# Patient Record
Sex: Female | Born: 1990 | Race: White | Hispanic: No | Marital: Married | Smoking: Former smoker
Health system: Southern US, Community
[De-identification: ages and names within clinical notes are randomized; demographics above are authoritative.]

## PROBLEM LIST (undated history)

## (undated) ENCOUNTER — Inpatient Hospital Stay (HOSPITAL_COMMUNITY): Payer: Self-pay

## (undated) DIAGNOSIS — F419 Anxiety disorder, unspecified: Secondary | ICD-10-CM

## (undated) DIAGNOSIS — B009 Herpesviral infection, unspecified: Secondary | ICD-10-CM

## (undated) DIAGNOSIS — J45909 Unspecified asthma, uncomplicated: Secondary | ICD-10-CM

## (undated) HISTORY — PX: APPENDECTOMY: SHX54

---

## 2018-03-15 ENCOUNTER — Inpatient Hospital Stay (HOSPITAL_COMMUNITY): Payer: Medicaid Other

## 2018-03-15 ENCOUNTER — Encounter (HOSPITAL_COMMUNITY): Payer: Self-pay | Admitting: *Deleted

## 2018-03-15 ENCOUNTER — Other Ambulatory Visit: Payer: Self-pay

## 2018-03-15 ENCOUNTER — Inpatient Hospital Stay (HOSPITAL_COMMUNITY)
Admission: AD | Admit: 2018-03-15 | Discharge: 2018-03-15 | Disposition: A | Discharge status: Home / Self Care | Payer: Medicaid Other | Source: Ambulatory Visit | Attending: Obstetrics and Gynecology | Admitting: Obstetrics and Gynecology

## 2018-03-15 DIAGNOSIS — O200 Threatened abortion: Secondary | ICD-10-CM | POA: Insufficient documentation

## 2018-03-15 DIAGNOSIS — O209 Hemorrhage in early pregnancy, unspecified: Secondary | ICD-10-CM

## 2018-03-15 DIAGNOSIS — O26891 Other specified pregnancy related conditions, first trimester: Secondary | ICD-10-CM

## 2018-03-15 DIAGNOSIS — O208 Other hemorrhage in early pregnancy: Secondary | ICD-10-CM | POA: Diagnosis not present

## 2018-03-15 DIAGNOSIS — R109 Unspecified abdominal pain: Secondary | ICD-10-CM | POA: Diagnosis not present

## 2018-03-15 HISTORY — DX: Anxiety disorder, unspecified: F41.9

## 2018-03-15 HISTORY — DX: Unspecified asthma, uncomplicated: J45.909

## 2018-03-15 HISTORY — DX: Herpesviral infection, unspecified: B00.9

## 2018-03-15 LAB — WET PREP, GENITAL
CLUE CELLS WET PREP: NONE SEEN
Sperm: NONE SEEN
TRICH WET PREP: NONE SEEN
Yeast Wet Prep HPF POC: NONE SEEN

## 2018-03-15 LAB — URINALYSIS, ROUTINE W REFLEX MICROSCOPIC
BACTERIA UA: NONE SEEN
Bilirubin Urine: NEGATIVE
GLUCOSE, UA: NEGATIVE mg/dL
Ketones, ur: NEGATIVE mg/dL
Leukocytes, UA: NEGATIVE
Nitrite: NEGATIVE
PH: 7 (ref 5.0–8.0)
PROTEIN: NEGATIVE mg/dL
Specific Gravity, Urine: 1.004 — ABNORMAL LOW (ref 1.005–1.030)

## 2018-03-15 LAB — CBC
HCT: 37.4 % (ref 36.0–46.0)
Hemoglobin: 13.3 g/dL (ref 12.0–15.0)
MCH: 33.8 pg (ref 26.0–34.0)
MCHC: 35.6 g/dL (ref 30.0–36.0)
MCV: 94.9 fL (ref 78.0–100.0)
PLATELETS: 303 10*3/uL (ref 150–400)
RBC: 3.94 MIL/uL (ref 3.87–5.11)
RDW: 12.2 % (ref 11.5–15.5)
WBC: 7.7 10*3/uL (ref 4.0–10.5)

## 2018-03-15 LAB — HCG, QUANTITATIVE, PREGNANCY: HCG, BETA CHAIN, QUANT, S: 469 m[IU]/mL — AB (ref <5)

## 2018-03-15 LAB — ABO/RH: ABO/RH(D): O POS

## 2018-03-15 LAB — POCT PREGNANCY, URINE: Preg Test, Ur: POSITIVE — AB

## 2018-03-15 MED ORDER — IBUPROFEN 600 MG PO TABS: 600.0000 mg | ORAL_TABLET | Freq: Four times a day (QID) | ORAL | 1 refills | Status: DC | PRN

## 2018-03-15 NOTE — Discharge Instructions (Signed)
Threatened Miscarriage °A threatened miscarriage is when you have vaginal bleeding during your first 20 weeks of pregnancy but the pregnancy has not ended. Your doctor will do tests to make sure you are still pregnant. The cause of the bleeding may not be known. This condition does not mean your pregnancy will end. It does increase the risk of it ending (complete miscarriage). °Follow these instructions at home: °· Make sure you keep all your doctor visits for prenatal care. °· Get plenty of rest. °· Do not have sex or use tampons if you have vaginal bleeding. °· Do not douche. °· Do not smoke or use drugs. °· Do not drink alcohol. °· Avoid caffeine. °Contact a doctor if: °· You have light bleeding from your vagina. °· You have belly pain or cramping. °· You have a fever. °Get help right away if: °· You have heavy bleeding from your vagina. °· You have clots of blood coming from your vagina. °· You have bad pain or cramps in your low back or belly. °· You have fever, chills, and bad belly pain. °This information is not intended to replace advice given to you by your health care provider. Make sure you discuss any questions you have with your health care provider. °Document Released: 08/13/2008 Document Revised: 02/06/2016 Document Reviewed: 06/27/2013 °Elsevier Interactive Patient Education © 2018 Elsevier Inc. ° °

## 2018-03-15 NOTE — MAU Provider Note (Signed)
Chief Complaint: Vaginal Bleeding; Abdominal Pain; and Possible Pregnancy   First Provider Initiated Contact with Patient 03/15/18 1426     SUBJECTIVE HPI: Natalie Wang is a 27 y.o. G1P0 at 2532w2d by certain LMP who presents to Maternity Admissions reporting:  Vaginal Bleeding: Spotting that had progressed to moderate vaginal bleeding today Passage of tissue or clots: Denies Dizziness: Present today Abd Pain: Moderate cramping today.  [facility]. No other testing this pregnancy.   O POS Performed at [facility], [address] [place], [place] [address]   Pain Location: Low abd, L>R Quality: cramping Severity: 7/10 on pain scale Duration: several days Course: worsening Context: early pregnancy Timing: intermittent Modifying factors: There are no aggravating or aleviating factors.  Associated signs and symptoms: Pos for VB, dizziness. Neg for fever, chills, urinary complaints, GI complaints  Past Medical History:  Diagnosis Date  . Anxiety   . Asthma   . Herpes    OB History  Gravida Para Term Preterm AB Living  1            SAB TAB Ectopic Multiple Live Births               # Outcome Date GA Lbr Len/2nd Weight Sex Delivery Anes PTL Lv  1 Current            Past Surgical History:  Procedure Laterality Date  . APPENDECTOMY     Social History   Socioeconomic History  . Marital status: Married    Spouse name: Not on file  . Number of children: Not on file  . Years of education: Not on file  . Highest education level: Not on file  Occupational History  . Not on file  Social Needs  . Financial resource strain: Not on file  . Food insecurity:    Worry: Not on file    Inability: Not on file  . Transportation needs:    Medical: Not on file    Non-medical: Not on file  Tobacco Use  . Smoking status: Former Smoker    Last attempt to quit: 02/27/2018    Years since quitting: 0.0  . Smokeless tobacco: Never Used  Substance  and Sexual Activity  . Alcohol use: Not Currently  . Drug use: Not Currently  . Sexual activity: Yes    Birth control/protection: None  Lifestyle  . Physical activity:    Days per week: Not on file    Minutes per session: Not on file  . Stress: Not on file  Relationships  . Social connections:    Talks on phone: Not on file    Gets together: Not on file    Attends religious service: Not on file    Active member of club or organization: Not on file    Attends meetings of clubs or organizations: Not on file    Relationship status: Not on file  . Intimate partner violence:    Fear of current or ex partner: Not on file    Emotionally abused: Not on file    Physically abused: Not on file    Forced sexual activity: Not on file  Other Topics Concern  . Not on file  Social History Narrative  . Not on file   No current facility-administered medications on file prior to encounter.    Current Outpatient Medications on File Prior to Encounter  Medication Sig Dispense Refill  . albuterol (PROVENTIL HFA;VENTOLIN HFA) 108 (90 Base) MCG/ACT inhaler Inhale  1-2 puffs into the lungs every 6 (six) hours as needed for wheezing or shortness of breath.    . Prenatal Vit-Fe Fumarate-FA (PRENATAL MULTIVITAMIN) TABS tablet Take 1 tablet by mouth daily at 12 noon.    . valACYclovir (VALTREX) 500 MG tablet Take 500 mg by mouth daily.     No Known Allergies  I have reviewed the past Medical Hx, Surgical Hx, Social Hx, Allergies and Medications.   Review of Systems  Constitutional: Negative for appetite change, chills and fever.  Gastrointestinal: Positive for abdominal pain. Negative for abdominal distention, constipation, diarrhea and vomiting.  Genitourinary: Positive for vaginal bleeding. Negative for difficulty urinating, dyspareunia, dysuria, flank pain, frequency, hematuria and vaginal discharge.  Musculoskeletal: Positive for back pain.  Neurological: Positive for dizziness. Negative for  headaches.    OBJECTIVE Patient Vitals for the past 24 hrs:  BP Temp Temp src Pulse Resp SpO2 Height Weight  03/15/18 1828 (!) 104/59 - - 78 - - - -  03/15/18 1320 121/72 98.6 F (37 C) Oral 76 18 100 % 5\' 6"  (1.676 m) 166 lb (75.3 kg)   Constitutional: Well-developed, well-nourished female in mild physical distress. Very anxious, tearful.  Cardiovascular: normal rate Respiratory: normal rate and effort.  GI: Abd soft, mild low abd tenderness. Pos BS x 4 MS: Extremities nontender, no edema, normal ROM Neurologic: Alert and oriented x 4.  GU: Neg CVAT.  SPECULUM EXAM: NEFG, physiologic discharge, small amount of dark red blood noted, cervix clean  BIMANUAL: cervix closed; uterus top-normal size, no adnexal tenderness or masses. No CMT.  LAB RESULTS Results for orders placed or performed during the hospital encounter of 03/15/18 (from the past 24 hour(s))  Urinalysis, Routine w reflex microscopic     Status: Abnormal   Collection Time: 03/15/18  1:31 PM  Result Value Ref Range   Color, Urine STRAW (A) YELLOW   APPearance CLEAR CLEAR   Specific Gravity, Urine 1.004 (L) 1.005 - 1.030   pH 7.0 5.0 - 8.0   Glucose, UA NEGATIVE NEGATIVE mg/dL   Hgb urine dipstick LARGE (A) NEGATIVE   Bilirubin Urine NEGATIVE NEGATIVE   Ketones, ur NEGATIVE NEGATIVE mg/dL   Protein, ur NEGATIVE NEGATIVE mg/dL   Nitrite NEGATIVE NEGATIVE   Leukocytes, UA NEGATIVE NEGATIVE   RBC / HPF 11-20 0 - 5 RBC/hpf   WBC, UA 0-5 0 - 5 WBC/hpf   Bacteria, UA NONE SEEN NONE SEEN   Mucus PRESENT   Pregnancy, urine POC     Status: Abnormal   Collection Time: 03/15/18  1:34 PM  Result Value Ref Range   Preg Test, Ur POSITIVE (A) NEGATIVE  hCG, quantitative, pregnancy     Status: Abnormal   Collection Time: 03/15/18  2:12 PM  Result Value Ref Range   hCG, Beta Chain, Quant, S 469 (H) <5 mIU/mL  CBC     Status: None   Collection Time: 03/15/18  2:12 PM  Result Value Ref Range   WBC 7.7 4.0 - 10.5 K/uL   RBC  3.94 3.87 - 5.11 MIL/uL   Hemoglobin 13.3 12.0 - 15.0 g/dL   HCT 16.1 09.6 - 04.5 %   MCV 94.9 78.0 - 100.0 fL   MCH 33.8 26.0 - 34.0 pg   MCHC 35.6 30.0 - 36.0 g/dL   RDW 40.9 81.1 - 91.4 %   Platelets 303 150 - 400 K/uL  ABO/Rh     Status: None (Preliminary result)   Collection Time: 03/15/18  2:12 PM  Result Value Ref Range   ABO/RH(D)      O POS Performed at [facility], [address] [place], [place] [address]   Wet prep, genital     Status: Abnormal   Collection Time: 03/15/18  4:05 PM  Result Value Ref Range   Yeast Wet Prep HPF POC NONE SEEN NONE SEEN   Trich, Wet Prep NONE SEEN NONE SEEN   Clue Cells Wet Prep HPF POC NONE SEEN NONE SEEN   WBC, Wet Prep HPF POC FEW (A) NONE SEEN   Sperm NONE SEEN     IMAGING US Ob Comp Less 14 Wks  Result Date: 03/15/2018 CLINICAL DATA:  Vaginal bleeding and abdominal pain in first trimester of pregnancy EXAM: OBSTETRIC <14 WK [place] AND TRANSVAGINAL OB US TECHNIQUE: Both transabdominal and transvaginal ultrasound examinations were performed for complete evaluation of the gestation as well as the maternal uterus, adnexal regions, and pelvic cul-de-sac. Transvaginal technique was performed to assess early pregnancy. COMPARISON:  None FINDINGS: Intrauterine gestational sac: Present, single, located in lower uterine segment Yolk sac:  Absent Embryo: Questionably present Cardiac Activity: Uncertain Heart Rate: See below  bpm CRL: 10.3 ?  mm   (7 w   1 d)               [place] EDC: Subchorionic hemorrhage:  None visualized. Maternal uterus/adnexae: An elongated apparent gestational sac is identified at the lower uterine segment. Internal nonspecific soft tissue is seen which could represent a fetal pole though not definitive. M-mode tracing show minimal cardiac activity varying from 58 to 88 beats per minute, uncertain if represents fetal cardiac activity or maternal maternal. No free pelvic fluid. RIGHT ovary normal size and morphology, 1.6 x 2.9 x 1.7  cm. LEFT ovary normal size and morphology, 3.0 x 1.9 x 1.9 cm. No adnexal masses. IMPRESSION: Apparent elongated gestational sac identified at the lower uterine segment containing nonspecific soft tissue, uncertain if represents a fetal pole. No yolk sac identified. Findings are suspicious but not yet definitive for failed pregnancy. Recommend follow-up US in 10-14 days for definitive diagnosis. This recommendation follows SRU consensus guidelines: Diagnostic Criteria for Nonviable Pregnancy Early in the First Trimester. Malva Limes Med 2013; 914:7829-56. Electronically Signed   By: Ulyses Southward M.D.   On: 03/15/2018 17:22   US Ob Transvaginal  Result Date: 03/15/2018 CLINICAL DATA:  Vaginal bleeding and abdominal pain in first trimester of pregnancy EXAM: OBSTETRIC <14 WK [place] AND TRANSVAGINAL OB US TECHNIQUE: Both transabdominal and transvaginal ultrasound examinations were performed for complete evaluation of the gestation as well as the maternal uterus, adnexal regions, and pelvic cul-de-sac. Transvaginal technique was performed to assess early pregnancy. COMPARISON:  None FINDINGS: Intrauterine gestational sac: Present, single, located in lower uterine segment Yolk sac:  Absent Embryo: Questionably present Cardiac Activity: Uncertain Heart Rate: See below  bpm CRL: 10.3 ?  mm   (7 w   1 d)               [place] EDC: Subchorionic hemorrhage:  None visualized. Maternal uterus/adnexae: An elongated apparent gestational sac is identified at the lower uterine segment. Internal nonspecific soft tissue is seen which could represent a fetal pole though not definitive. M-mode tracing show minimal cardiac activity varying from 58 to 88 beats per minute, uncertain if represents fetal cardiac activity or maternal maternal. No free pelvic fluid. RIGHT ovary normal size and morphology, 1.6 x 2.9 x 1.7 cm. LEFT ovary normal size and morphology, 3.0  x 1.9 x 1.9 cm. No adnexal masses. IMPRESSION: Apparent elongated gestational sac  identified at the lower uterine segment containing nonspecific soft tissue, uncertain if represents a fetal pole. No yolk sac identified. Findings are suspicious but not yet definitive for failed pregnancy. Recommend follow-up US in 10-14 days for definitive diagnosis. This recommendation follows SRU consensus guidelines: Diagnostic Criteria for Nonviable Pregnancy Early in the First Trimester. Malva Limes Med 2013; 119:1478-29. Electronically Signed   By: Ulyses Southward M.D.   On: 03/15/2018 17:22    MAU COURSE CBC, Quant, ABO/Rh, ultrasound, wet prep and GC/chlamydia culture, UA  MDM Pain and bleeding in early pregnancy with pregnancy of unknown anatomic location (although possible bradycardic fetus seen on [place]), but hemodynamically stable. Explained that distorted gestational sac in LUS w/ moderate bleeding is likely a miscarriage in process it is necessary to check hCG in ~48 hours.   ASSESSMENT 1. Abdominal pain during pregnancy in first trimester   2. Vaginal bleeding affecting early pregnancy   3. Threatened miscarriage in early pregnancy     PLAN Discharge home in stable condition. SAB/Ectopic precautions Support given Quant 7/5 9:00 am Follow-up Information    [facility] Follow up on 03/18/2018.   Specialty:  Obstetrics and Gynecology Why:  at 9:00 am for bloodwork. Be prepared to stay two hours for results.  The office will schedule a follow-up appointment in about 2 weeks.  Contact information: [address] [place] [address] [place] [address] [phone]         Allergies as of 03/15/2018   No Known Allergies     Medication List    TAKE these medications   albuterol 108 (90 Base) MCG/ACT inhaler Commonly known as:  PROVENTIL HFA;VENTOLIN HFA Inhale 1-2 puffs into the lungs every 6 (six) hours as needed for wheezing or shortness of breath.   ibuprofen 600 MG tablet Commonly known as:  ADVIL,MOTRIN Take 1 tablet (600 mg total) by mouth every 6  (six) hours as needed for moderate pain.   prenatal multivitamin Tabs tablet Take 1 tablet by mouth daily at 12 noon.   valACYclovir 500 MG tablet Commonly known as:  VALTREX Take 500 mg by mouth daily.        Katrinka Blazing, [place] 03/15/2018  6:33 PM  4

## 2018-03-15 NOTE — MAU Note (Signed)
Having extreme lower belly pain started around 1130.  Was swimming and started cramping up. Got out of the pool, pain continued, felt dizzy, started spotting.  Pain is continuing. [facility]

## 2018-03-16 LAB — GC/CHLAMYDIA PROBE AMP (~~LOC~~) NOT AT ARMC
Chlamydia: NEGATIVE
Neisseria Gonorrhea: NEGATIVE

## 2018-03-16 LAB — HIV ANTIBODY (ROUTINE TESTING W REFLEX): HIV SCREEN 4TH GENERATION: NONREACTIVE

## 2018-03-18 ENCOUNTER — Ambulatory Visit (INDEPENDENT_AMBULATORY_CARE_PROVIDER_SITE_OTHER): Payer: Medicaid Other | Admitting: General Practice

## 2018-03-18 ENCOUNTER — Encounter: Payer: Self-pay | Admitting: General Practice

## 2018-03-18 ENCOUNTER — Telehealth: Payer: Self-pay | Admitting: General Practice

## 2018-03-18 DIAGNOSIS — O200 Threatened abortion: Secondary | ICD-10-CM

## 2018-03-18 LAB — HCG, QUANTITATIVE, PREGNANCY: hCG, Beta Chain, Quant, S: 242 m[IU]/mL — ABNORMAL HIGH (ref <5)

## 2018-03-18 NOTE — Telephone Encounter (Signed)
Called patient & informed her of bhcg results decreasing indicating a SAB. Discussed follow up bhcg in 1 week and offered appt 7/12 @ 930. Patient verbalized understanding to all and states she can come then. Patient asked about how long she needed to wait before trying to get pregnant again. Discussed the recommendation is waiting 3 months and let her body have 2-3 normal cycles before trying again. Patient verbalized understanding & had no other questions.

## 2018-03-18 NOTE — Progress Notes (Signed)
Patient presents to office today for stat bhcg. Patient reports continued heavy bleeding like a period as well as cramping. Discussed with patient we are monitoring her bhcg levels today & asked she wait in lobby for results/updated plan of care. Patient verbalized understanding but has desire to leave and be contacted by phone as she is leaving to go out of town today- per [employer] EntertainmentJennifer Rasch this is okay. Will contact patient with results when available. Patient had no questions at this time. Reviewed results with Venia CarbonJennifer Rasch who finds decreasing bhcg levels indicative of SAB. Patient should have follow up bhcg in 1 week. Will call patient with results.

## 2018-03-19 NOTE — Progress Notes (Signed)
I agree with the RN's note and plan of care.   Venia Carbonasch, Jennifer I, NP 03/19/2018 11:02 PM

## 2018-03-25 ENCOUNTER — Other Ambulatory Visit: Payer: Medicaid Other

## 2018-03-25 DIAGNOSIS — O039 Complete or unspecified spontaneous abortion without complication: Secondary | ICD-10-CM

## 2018-03-25 NOTE — Progress Notes (Unsigned)
bet

## 2018-03-26 LAB — BETA HCG QUANT (REF LAB): hCG Quant: 83 m[IU]/mL

## 2018-03-30 ENCOUNTER — Telehealth: Payer: Self-pay | Admitting: *Deleted

## 2018-03-30 DIAGNOSIS — O039 Complete or unspecified spontaneous abortion without complication: Secondary | ICD-10-CM

## 2018-03-30 NOTE — Telephone Encounter (Signed)
Patient called and left a voicemessage today that needs the results from her blood test from Friday and what the next steps are.

## 2018-03-31 NOTE — Telephone Encounter (Signed)
Called patient & informed her of bhcg results and recommended follow up. Patient verbalized understanding & asked if this was normal that it isn't at 0 by now. Told patient it is very normal as it takes time for the body to recover and for the hormones to drop completely. Patient verbalized understanding & states she can come 7/23 @ 2pm. Patient had no other questions

## 2018-04-05 ENCOUNTER — Other Ambulatory Visit: Payer: Medicaid Other

## 2018-04-05 DIAGNOSIS — O039 Complete or unspecified spontaneous abortion without complication: Secondary | ICD-10-CM

## 2018-04-06 LAB — BETA HCG QUANT (REF LAB): hCG Quant: <1 m[IU]/mL

## 2018-04-08 ENCOUNTER — Encounter: Payer: Self-pay | Admitting: Obstetrics and Gynecology

## 2018-07-28 ENCOUNTER — Ambulatory Visit (INDEPENDENT_AMBULATORY_CARE_PROVIDER_SITE_OTHER): Payer: Medicaid Other | Admitting: Family Medicine

## 2018-07-28 ENCOUNTER — Encounter: Payer: Self-pay | Admitting: Family Medicine

## 2018-07-28 ENCOUNTER — Other Ambulatory Visit (HOSPITAL_COMMUNITY)
Admission: RE | Admit: 2018-07-28 | Discharge: 2018-07-28 | Disposition: A | Discharge status: Home / Self Care | Payer: Medicaid Other | Source: Ambulatory Visit | Attending: Family Medicine | Admitting: Family Medicine

## 2018-07-28 VITALS — BP 118/53 | HR 70 | Wt 169.0 lb

## 2018-07-28 DIAGNOSIS — Z3481 Encounter for supervision of other normal pregnancy, first trimester: Secondary | ICD-10-CM | POA: Diagnosis not present

## 2018-07-28 DIAGNOSIS — Z3400 Encounter for supervision of normal first pregnancy, unspecified trimester: Secondary | ICD-10-CM

## 2018-07-28 NOTE — Patient Instructions (Signed)
First Trimester of Pregnancy The first trimester of pregnancy is from week 1 until the end of week 13 (months 1 through 3). During this time, your baby will begin to develop inside you. At 6-8 weeks, the eyes and face are formed, and the heartbeat can be seen on ultrasound. At the end of 12 weeks, all the baby's organs are formed. Prenatal care is all the medical care you receive before the birth of your baby. Make sure you get good prenatal care and follow all of your doctor's instructions. Follow these instructions at home: Medicines  Take over-the-counter and prescription medicines only as told by your doctor. Some medicines are safe and some medicines are not safe during pregnancy.  Take a prenatal vitamin that contains at least 600 micrograms (mcg) of folic acid.  If you have trouble pooping (constipation), take medicine that will make your stool soft (stool softener) if your doctor approves. Eating and drinking  Eat regular, healthy meals.  Your doctor will tell you the amount of weight gain that is right for you.  Avoid raw meat and uncooked cheese.  If you feel sick to your stomach (nauseous) or throw up (vomit): ? Eat 4 or 5 small meals a day instead of 3 large meals. ? Try eating a few soda crackers. ? Drink liquids between meals instead of during meals.  To prevent constipation: ? Eat foods that are high in fiber, like fresh fruits and vegetables, whole grains, and beans. ? Drink enough fluids to keep your pee (urine) clear or pale yellow. Activity  Exercise only as told by your doctor. Stop exercising if you have cramps or pain in your lower belly (abdomen) or low back.  Do not exercise if it is too hot, too humid, or if you are in a place of great height (high altitude).  Try to avoid standing for long periods of time. Move your legs often if you must stand in one place for a long time.  Avoid heavy lifting.  Wear low-heeled shoes. Sit and stand up straight.  You  can have sex unless your doctor tells you not to. Relieving pain and discomfort  Wear a good support bra if your breasts are sore.  Take warm water baths (sitz baths) to soothe pain or discomfort caused by hemorrhoids. Use hemorrhoid cream if your doctor says it is okay.  Rest with your legs raised if you have leg cramps or low back pain.  If you have puffy, bulging veins (varicose veins) in your legs: ? Wear support hose or compression stockings as told by your doctor. ? Raise (elevate) your feet for 15 minutes, 3-4 times a day. ? Limit salt in your food. Prenatal care  Schedule your prenatal visits by the twelfth week of pregnancy.  Write down your questions. Take them to your prenatal visits.  Keep all your prenatal visits as told by your doctor. This is important. Safety  Wear your seat belt at all times when driving.  Make a list of emergency phone numbers. The list should include numbers for family, friends, the hospital, and police and fire departments. General instructions  Ask your doctor for a referral to a local prenatal class. Begin classes no later than at the start of month 6 of your pregnancy.  Ask for help if you need counseling or if you need help with nutrition. Your doctor can give you advice or tell you where to go for help.  Do not use hot tubs, steam rooms, or   saunas.  Do not douche or use tampons or scented sanitary pads.  Do not cross your legs for long periods of time.  Avoid all herbs and alcohol. Avoid drugs that are not approved by your doctor.  Do not use any tobacco products, including cigarettes, chewing tobacco, and electronic cigarettes. If you need help quitting, ask your doctor. You may get counseling or other support to help you quit.  Avoid cat litter boxes and soil used by cats. These carry germs that can cause birth defects in the baby and can cause a loss of your baby (miscarriage) or stillbirth.  Visit your dentist. At home, brush  your teeth with a soft toothbrush. Be gentle when you floss. Contact a doctor if:  You are dizzy.  You have mild cramps or pressure in your lower belly.  You have a nagging pain in your belly area.  You continue to feel sick to your stomach, you throw up, or you have watery poop (diarrhea).  You have a bad smelling fluid coming from your vagina.  You have pain when you pee (urinate).  You have increased puffiness (swelling) in your face, hands, legs, or ankles. Get help right away if:  You have a fever.  You are leaking fluid from your vagina.  You have spotting or bleeding from your vagina.  You have very bad belly cramping or pain.  You gain or lose weight rapidly.  You throw up blood. It may look like coffee grounds.  You are around people who have German measles, fifth disease, or chickenpox.  You have a very bad headache.  You have shortness of breath.  You have any kind of trauma, such as from a fall or a car accident. Summary  The first trimester of pregnancy is from week 1 until the end of week 13 (months 1 through 3).  To take care of yourself and your unborn baby, you will need to eat healthy meals, take medicines only if your doctor tells you to do so, and do activities that are safe for you and your baby.  Keep all follow-up visits as told by your doctor. This is important as your doctor will have to ensure that your baby is healthy and growing well. This information is not intended to replace advice given to you by your health care provider. Make sure you discuss any questions you have with your health care provider. Document Released: 02/17/2008 Document Revised: 09/08/2016 Document Reviewed: 09/08/2016 Elsevier Interactive Patient Education  2017 Elsevier Inc.  

## 2018-07-28 NOTE — Progress Notes (Signed)
  Subjective:  Natalie Wang is a G2P0 6837w2d being seen today for her first obstetrical visit.  Her obstetrical history is significant for miscarriage in July. Planned pregnancy. FOB is patient's husband. Patient does intend to breast feed. Pregnancy history fully reviewed.  Patient reports nausea. Improved with probiotics.  BP (!) 118/53   Pulse 70   Wt 169 lb (76.7 kg)   LMP 05/31/2018 (Exact Date)   BMI 27.28 kg/m   HISTORY: OB History  Gravida Para Term Preterm AB Living  2            SAB TAB Ectopic Multiple Live Births               # Outcome Date GA Lbr Len/2nd Weight Sex Delivery Anes PTL Lv  2 Current           1 Gravida 03/2018            Past Medical History:  Diagnosis Date  . Anxiety   . Asthma   . Herpes     Past Surgical History:  Procedure Laterality Date  . APPENDECTOMY      Family History  Problem Relation Age of Onset  . Cancer Neg Hx   . Hypertension Neg Hx      Exam  BP (!) 118/53   Pulse 70   Wt 169 lb (76.7 kg)   LMP 05/31/2018 (Exact Date)   BMI 27.28 kg/m   CONSTITUTIONAL: Well-developed, well-nourished female in no acute distress.  HENT:  Normocephalic, atraumatic, External right and left ear normal. Oropharynx is clear and moist EYES: Conjunctivae and EOM are normal. Pupils are equal, round, and reactive to light. No scleral icterus.  NECK: Normal range of motion, supple, no masses.  Normal thyroid.  CARDIOVASCULAR: Normal heart rate noted, regular rhythm RESPIRATORY: Clear to auscultation bilaterally. Effort and breath sounds normal, no problems with respiration noted. BREASTS: Symmetric in size. No masses, skin changes, nipple drainage, or lymphadenopathy. ABDOMEN: Soft, normal bowel sounds, no distention noted.  No tenderness, rebound or guarding.  PELVIC: Normal appearing external genitalia; normal appearing vaginal mucosa and cervix. No abnormal discharge noted. Normal uterine size, no other palpable masses, no uterine or  adnexal tenderness. MUSCULOSKELETAL: Normal range of motion. No tenderness.  No cyanosis, clubbing, or edema.  2+ distal pulses. SKIN: Skin is warm and dry. No rash noted. Not diaphoretic. No erythema. No pallor. NEUROLOGIC: Alert and oriented to person, place, and time. Normal reflexes, muscle tone coordination. No cranial nerve deficit noted. PSYCHIATRIC: Normal mood and affect. Normal behavior. Normal judgment and thought content.    Assessment:    Pregnancy: G2P0 Patient Active Problem List   Diagnosis Date Noted  . Supervision of normal first pregnancy, antepartum 07/28/2018      Plan:   1. Supervision of normal first pregnancy, antepartum FHT and FH normal - Obstetric Panel, Including HIV - Urine Culture - Cytology - PAP( [place]) - Enroll Patient in Babyscripts     Problem list reviewed and updated. 75% of 45 min visit spent on counseling and coordination of care.     Levie HeritageJacob J  07/28/2018

## 2018-07-28 NOTE — Progress Notes (Signed)
Patient had miscarriage in July 2019. Natalie StammerJennifer Arrin Ishler RN   DATING AND VIABILITY SONOGRAM   Natalie CommentShaylene Wang is a 27 y.o. year old G2P0 with LMP Patient's last menstrual period was 05/31/2018 (exact date). which would correlate to  6481w2d weeks gestation.  She has regular menstrual cycles.   She is here today for a confirmatory initial sonogram.    GESTATION: SINGLETON     FETAL ACTIVITY:          Heart rate    173              The fetus is active.    ADNEXA: The ovaries are normal.   GESTATIONAL AGE AND  BIOMETRICS:  Gestational criteria: Estimated Date of Delivery: 03/07/19 by LMP now at 9281w2d  Previous Scans:0      CROWN RUMP LENGTH          1. 78cm       8-1weeks                                                                               AVERAGE EGA(BY THIS SCAN):8-1 weeks  WORKING EDD( LMP ):  03-07-2019     Natalie StammerJennifer Natalie Wang 07/28/2018 10:30 AM

## 2018-07-29 LAB — OBSTETRIC PANEL, INCLUDING HIV
ANTIBODY SCREEN: NEGATIVE
BASOS: 0 %
Basophils Absolute: 0 10*3/uL (ref 0.0–0.2)
EOS (ABSOLUTE): 0 10*3/uL (ref 0.0–0.4)
Eos: 0 %
HEMATOCRIT: 35.2 % (ref 34.0–46.6)
HIV SCREEN 4TH GENERATION: NONREACTIVE
Hemoglobin: 12.2 g/dL (ref 11.1–15.9)
Hepatitis B Surface Ag: NEGATIVE
Immature Grans (Abs): 0 10*3/uL (ref 0.0–0.1)
Immature Granulocytes: 0 %
Lymphocytes Absolute: 1.5 10*3/uL (ref 0.7–3.1)
Lymphs: 17 %
MCH: 32.7 pg (ref 26.6–33.0)
MCHC: 34.7 g/dL (ref 31.5–35.7)
MCV: 94 fL (ref 79–97)
Monocytes Absolute: 0.5 10*3/uL (ref 0.1–0.9)
Monocytes: 6 %
NEUTROS ABS: 6.9 10*3/uL (ref 1.4–7.0)
Neutrophils: 77 %
Platelets: 334 10*3/uL (ref 150–450)
RBC: 3.73 x10E6/uL — ABNORMAL LOW (ref 3.77–5.28)
RDW: 12.5 % (ref 12.3–15.4)
RH TYPE: POSITIVE
RPR Ser Ql: NONREACTIVE
RUBELLA: 2.43 {index} (ref >0.99)
WBC: 9 10*3/uL (ref 3.4–10.8)

## 2018-07-29 LAB — CYTOLOGY - PAP
ADEQUACY: ABSENT
CHLAMYDIA, DNA PROBE: NEGATIVE
Diagnosis: NEGATIVE
Neisseria Gonorrhea: NEGATIVE

## 2018-08-25 ENCOUNTER — Ambulatory Visit (INDEPENDENT_AMBULATORY_CARE_PROVIDER_SITE_OTHER): Payer: Medicaid Other | Admitting: Family Medicine

## 2018-08-25 VITALS — BP 114/68 | HR 82 | Wt 168.0 lb

## 2018-08-25 DIAGNOSIS — O99341 Other mental disorders complicating pregnancy, first trimester: Secondary | ICD-10-CM | POA: Diagnosis not present

## 2018-08-25 DIAGNOSIS — O99340 Other mental disorders complicating pregnancy, unspecified trimester: Secondary | ICD-10-CM

## 2018-08-25 DIAGNOSIS — Z3401 Encounter for supervision of normal first pregnancy, first trimester: Secondary | ICD-10-CM | POA: Diagnosis not present

## 2018-08-25 DIAGNOSIS — F329 Major depressive disorder, single episode, unspecified: Secondary | ICD-10-CM

## 2018-08-25 DIAGNOSIS — M9901 Segmental and somatic dysfunction of cervical region: Secondary | ICD-10-CM

## 2018-08-25 DIAGNOSIS — R51 Headache: Secondary | ICD-10-CM

## 2018-08-25 DIAGNOSIS — O210 Mild hyperemesis gravidarum: Secondary | ICD-10-CM | POA: Diagnosis not present

## 2018-08-25 DIAGNOSIS — R519 Headache, unspecified: Secondary | ICD-10-CM

## 2018-08-25 DIAGNOSIS — Z3400 Encounter for supervision of normal first pregnancy, unspecified trimester: Secondary | ICD-10-CM

## 2018-08-25 MED ORDER — DOXYLAMINE-PYRIDOXINE 10-10 MG PO TBEC: 2.0000 | DELAYED_RELEASE_TABLET | Freq: Every day | ORAL | 5 refills | Status: DC

## 2018-08-25 NOTE — Progress Notes (Signed)
Pt states that she has been having headaches 2 to 3 times a week for about 2 weeks.

## 2018-08-25 NOTE — Progress Notes (Signed)
   PRENATAL VISIT NOTE  Subjective:  Natalie Wang is a [age] y.o. G2P0 at 2281w2d being seen today for ongoing prenatal care.  She is currently monitored for the following issues for this low-risk pregnancy and has Supervision of normal first pregnancy, antepartum on their problem list.  Patient reports nausea - appears to be somewhat worse. Now happening every day - tolerating food, though. Headache 3-4 times a week - posterior headache with neck tightness. No head trauma. Also having increased depression. Has depression in the past - was on celexa at one point, which worked well. .  Contractions: Not present. Vag. Bleeding: None.  Movement: Absent. Denies leaking of fluid.   The following portions of the patient's history were reviewed and updated as appropriate: allergies, current medications, past family history, past medical history, past social history, past surgical history and problem list. Problem list updated.  Objective:   Vitals:   08/25/18 0908  BP: 114/68  Pulse: 82  Weight: 168 lb (76.2 kg)    Fetal Status: Fetal Heart Rate (bpm): 158   Movement: Absent     General:  Alert, oriented and cooperative. Patient is in no acute distress.  Skin: Skin is warm and dry. No rash noted.   Cardiovascular: Normal heart rate noted  Respiratory: Normal respiratory effort, no problems with respiration noted  Abdomen: Soft, gravid, appropriate for gestational age. Pain/Pressure: Present     Pelvic:  Cervical exam deferred        MSK: Restriction, tenderness, tissue texture changes, and paraspinal spasm in the cervical spine  Neuro: Moves all four extremities with no focal neurological deficit  Extremities: Normal range of motion.  Edema: None  Mental Status: Normal mood and affect. Normal behavior. Normal judgment and thought content.   OSE: Head OA SLRR  Cervical C3 FSRR, C6 FSRL  Thoracic T4 FSRL  Rib 4 inhaled  Lumbar   Sacrum   Pelvis     Assessment and Plan:  Pregnancy:  G2P0 at 6581w2d  1. Supervision of normal first pregnancy, antepartum FHT and FH normal - Genetic Screening  2. Frequent headaches OMT done as below.  3. Hyperemesis gravidarum Diclegis prescribed  4. Depression affecting pregnancy Discussed possibility of starting medication - patient would like to avoid this if possible. She thinks that may improve with decreasing headaches and nausea. Will work on these aspects. Discussed that control of depressant, even with medication, important for psycho-social development of baby.  5. Somatic dysfunction of spine, cervical OMT done after patient permission. HVLA technique utilized. 4 areas treated with improvement of tissue texture and joint mobility. Patient tolerated procedure well.     Preterm labor symptoms and general obstetric precautions including but not limited to vaginal bleeding, contractions, leaking of fluid and fetal movement were reviewed in detail with the patient. Please refer to After Visit Summary for other counseling recommendations.  Return in about 2 weeks (around 09/08/2018) for OB f/u, headaches.  Levie HeritageStinson, [name], DO

## 2018-09-08 ENCOUNTER — Ambulatory Visit (INDEPENDENT_AMBULATORY_CARE_PROVIDER_SITE_OTHER): Payer: Medicaid Other | Admitting: Family Medicine

## 2018-09-08 VITALS — BP 121/62 | HR 91 | Wt 167.0 lb

## 2018-09-08 DIAGNOSIS — Z3400 Encounter for supervision of normal first pregnancy, unspecified trimester: Secondary | ICD-10-CM

## 2018-09-08 DIAGNOSIS — O99342 Other mental disorders complicating pregnancy, second trimester: Secondary | ICD-10-CM

## 2018-09-08 DIAGNOSIS — O99340 Other mental disorders complicating pregnancy, unspecified trimester: Secondary | ICD-10-CM

## 2018-09-08 DIAGNOSIS — F329 Major depressive disorder, single episode, unspecified: Secondary | ICD-10-CM

## 2018-09-08 DIAGNOSIS — R519 Headache, unspecified: Secondary | ICD-10-CM

## 2018-09-08 DIAGNOSIS — Z3402 Encounter for supervision of normal first pregnancy, second trimester: Secondary | ICD-10-CM

## 2018-09-08 DIAGNOSIS — R51 Headache: Secondary | ICD-10-CM

## 2018-09-08 DIAGNOSIS — B009 Herpesviral infection, unspecified: Secondary | ICD-10-CM | POA: Insufficient documentation

## 2018-09-08 DIAGNOSIS — O210 Mild hyperemesis gravidarum: Secondary | ICD-10-CM | POA: Insufficient documentation

## 2018-09-08 DIAGNOSIS — F32A Depression, unspecified: Secondary | ICD-10-CM

## 2018-09-08 NOTE — Progress Notes (Signed)
   PRENATAL VISIT NOTE  Subjective:  Natalie Wang is a 27 y.o. G2P0 at 3422w2d being seen today for ongoing prenatal care.  She is currently monitored for the following issues for this low-risk pregnancy and has Supervision of normal first pregnancy, antepartum on their problem list.  Patient reports nausea improved with diclegis. Seeing chiropractor for headaches - somewhat improved..  Contractions: Not present. Vag. Bleeding: None.  Movement: Absent. Denies leaking of fluid.   The following portions of the patient's history were reviewed and updated as appropriate: allergies, current medications, past family history, past medical history, past social history, past surgical history and problem list. Problem list updated.  Objective:   Vitals:   09/08/18 1332  BP: 121/62  Pulse: 91  Weight: 167 lb (75.8 kg)    Fetal Status: Fetal Heart Rate (bpm): 150   Movement: Absent     General:  Alert, oriented and cooperative. Patient is in no acute distress.  Skin: Skin is warm and dry. No rash noted.   Cardiovascular: Normal heart rate noted  Respiratory: Normal respiratory effort, no problems with respiration noted  Abdomen: Soft, gravid, appropriate for gestational age.  Pain/Pressure: Present     Pelvic: Cervical exam deferred        Extremities: Normal range of motion.  Edema: None  Mental Status: Normal mood and affect. Normal behavior. Normal judgment and thought content.   Assessment and Plan:  Pregnancy: G2P0 at 4322w2d  1. Supervision of normal first pregnancy, antepartum FHT normal - US MFM OB COMP + 14 WK; Future  2. Frequent headaches Improving.  3. Hyperemesis gravidarum Continue diclegis  4. Depression Improved  Preterm labor symptoms and general obstetric precautions including but not limited to vaginal bleeding, contractions, leaking of fluid and fetal movement were reviewed in detail with the patient. Please refer to After Visit Summary for other counseling  recommendations.  Return in about 4 weeks (around 10/06/2018) for OB f/u.  No future appointments.  Levie HeritageJacob J Stinson, DO

## 2018-09-14 NOTE — L&D Delivery Note (Signed)
Delivery Note At 9:17 AM a viable and healthy female was delivered via Vaginal, Spontaneous (Presentation: ROA ).  APGAR: 8, 9; weight 7 lb 10.2 oz (3465 g).   Placenta status: spontaneous , intact.  Cord:3 vessel  with the following complications: none.    Anesthesia:  epidural Episiotomy: None Lacerations: 2nd degree;Labial Suture Repair: 3.0 monocryl Est. Blood Loss (mL): 300  Mom to postpartum.  Baby to Couplet care / Skin to Skin.  Natalie Wang is a 28 y.o. female G2P1011 with IUP at [redacted]w[redacted]d admitted for SOL/SROM.  She progressed with augmentation to complete and pushed less than 20 minutes to deliver.  Cord clamping delayed by 2-3 minutes then clamped by CNM and cut by FOB.  Placenta intact and spontaneous, bleeding minimal.  Small second degree laceration of upper right labia repaired without difficulty.  Mom and baby stable prior to transfer to postpartum. She plans on breastfeeding. She requests IUD for birth control, see separate note for postplacental IUD placement.   Fatima Blank 03/04/2019, 3:33 PM

## 2018-10-07 ENCOUNTER — Encounter (HOSPITAL_COMMUNITY): Payer: Self-pay

## 2018-10-11 ENCOUNTER — Other Ambulatory Visit (HOSPITAL_COMMUNITY): Payer: Medicaid Other

## 2018-10-13 ENCOUNTER — Ambulatory Visit (INDEPENDENT_AMBULATORY_CARE_PROVIDER_SITE_OTHER): Payer: Medicaid Other | Admitting: Family Medicine

## 2018-10-13 ENCOUNTER — Ambulatory Visit (HOSPITAL_COMMUNITY)
Admission: RE | Admit: 2018-10-13 | Discharge: 2018-10-13 | Disposition: A | Discharge status: Home / Self Care | Payer: Medicaid Other | Source: Ambulatory Visit | Attending: Family Medicine | Admitting: Family Medicine

## 2018-10-13 VITALS — BP 98/59 | HR 62 | Wt 175.0 lb

## 2018-10-13 DIAGNOSIS — O99342 Other mental disorders complicating pregnancy, second trimester: Secondary | ICD-10-CM

## 2018-10-13 DIAGNOSIS — Z3402 Encounter for supervision of normal first pregnancy, second trimester: Secondary | ICD-10-CM

## 2018-10-13 DIAGNOSIS — Z363 Encounter for antenatal screening for malformations: Secondary | ICD-10-CM | POA: Diagnosis not present

## 2018-10-13 DIAGNOSIS — Z3400 Encounter for supervision of normal first pregnancy, unspecified trimester: Secondary | ICD-10-CM

## 2018-10-13 DIAGNOSIS — F329 Major depressive disorder, single episode, unspecified: Secondary | ICD-10-CM

## 2018-10-13 DIAGNOSIS — Z3A19 19 weeks gestation of pregnancy: Secondary | ICD-10-CM | POA: Diagnosis not present

## 2018-10-13 DIAGNOSIS — O99340 Other mental disorders complicating pregnancy, unspecified trimester: Secondary | ICD-10-CM

## 2018-10-13 DIAGNOSIS — O210 Mild hyperemesis gravidarum: Secondary | ICD-10-CM

## 2018-10-13 NOTE — Progress Notes (Signed)
   PRENATAL VISIT NOTE  Subjective:  Natalie Wang is a 28 y.o. G2P0 at [redacted]w[redacted]d being seen today for ongoing prenatal care.  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  Patient reports no complaints.  Contractions: Not present. Vag. Bleeding: None.  Movement: Present. Denies leaking of fluid.   The following portions of the patient's history were reviewed and updated as appropriate: allergies, current medications, past family history, past medical history, past social history, past surgical history and problem list. Problem list updated.  Objective:   Vitals:   10/13/18 1501  BP: (!) 98/59  Pulse: 62  Weight: 175 lb (79.4 kg)    Fetal Status:     Movement: Present     General:  Alert, oriented and cooperative. Patient is in no acute distress.  Skin: Skin is warm and dry. No rash noted.   Cardiovascular: Normal heart rate noted  Respiratory: Normal respiratory effort, no problems with respiration noted  Abdomen: Soft, gravid, appropriate for gestational age.  Pain/Pressure: Absent     Pelvic: Cervical exam deferred        Extremities: Normal range of motion.  Edema: Trace  Mental Status: Normal mood and affect. Normal behavior. Normal judgment and thought content.   Assessment and Plan:  Pregnancy: G2P0 at [redacted]w[redacted]d  1. Supervision of normal first pregnancy, antepartum FHT and Fh normal - AFP, Serum, Open Spina Bifida - Culture, OB Urine  2. Depression affecting pregnancy improved  3. Hyperemesis gravidarum Improved on diclegis  Preterm labor symptoms and general obstetric precautions including but not limited to vaginal bleeding, contractions, leaking of fluid and fetal movement were reviewed in detail with the patient. Please refer to After Visit Summary for other counseling recommendations.  Return in about 4  weeks (around 11/10/2018).  Future Appointments  Date Time Provider Department Center  11/10/2018 11:00 AM Levie Heritage, DO [facility] None    Levie Heritage, DO

## 2018-10-15 LAB — AFP, SERUM, OPEN SPINA BIFIDA
AFP MoM: 1.02
AFP Value: 45.5 ng/mL
Gest. Age on Collection Date: 19 weeks
Maternal Age At EDD: 29.8 yr
OSBR RISK 1 IN: 10000
TEST RESULTS AFP: NEGATIVE
Weight: 175 [lb_av]

## 2018-10-15 LAB — CULTURE, OB URINE

## 2018-10-15 LAB — URINE CULTURE, OB REFLEX: Organism ID, Bacteria: NO GROWTH

## 2018-11-10 ENCOUNTER — Other Ambulatory Visit (HOSPITAL_COMMUNITY)
Admission: RE | Admit: 2018-11-10 | Discharge: 2018-11-10 | Disposition: A | Discharge status: Home / Self Care | Payer: Medicaid Other | Source: Ambulatory Visit | Attending: Family Medicine | Admitting: Family Medicine

## 2018-11-10 ENCOUNTER — Ambulatory Visit (INDEPENDENT_AMBULATORY_CARE_PROVIDER_SITE_OTHER): Payer: Medicaid Other | Admitting: Family Medicine

## 2018-11-10 VITALS — BP 118/64 | HR 85 | Wt 184.0 lb

## 2018-11-10 DIAGNOSIS — N898 Other specified noninflammatory disorders of vagina: Secondary | ICD-10-CM

## 2018-11-10 DIAGNOSIS — Z3A23 23 weeks gestation of pregnancy: Secondary | ICD-10-CM

## 2018-11-10 DIAGNOSIS — Z3402 Encounter for supervision of normal first pregnancy, second trimester: Secondary | ICD-10-CM

## 2018-11-10 DIAGNOSIS — Z3400 Encounter for supervision of normal first pregnancy, unspecified trimester: Secondary | ICD-10-CM

## 2018-11-10 DIAGNOSIS — O26899 Other specified pregnancy related conditions, unspecified trimester: Secondary | ICD-10-CM | POA: Diagnosis present

## 2018-11-10 DIAGNOSIS — O210 Mild hyperemesis gravidarum: Secondary | ICD-10-CM

## 2018-11-10 DIAGNOSIS — O26892 Other specified pregnancy related conditions, second trimester: Secondary | ICD-10-CM

## 2018-11-10 NOTE — Progress Notes (Signed)
   PRENATAL VISIT NOTE  Subjective:  Natalie Wang is a 28 y.o. G2P0 at [redacted]w[redacted]d being seen today for ongoing prenatal care.  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  Patient reports vaginal discharge with odor.  Contractions: Not present. Vag. Bleeding: None.  Movement: Present. Denies leaking of fluid.   The following portions of the patient's history were reviewed and updated as appropriate: allergies, current medications, past family history, past medical history, past social history, past surgical history and problem list. Problem list updated.  Objective:   Vitals:   11/10/18 1053  BP: 118/64  Pulse: 85  Weight: 184 lb (83.5 kg)    Fetal Status: Fetal Heart Rate (bpm): 151 Fundal Height: 24 cm Movement: Present     General:  Alert, oriented and cooperative. Patient is in no acute distress.  Skin: Skin is warm and dry. No rash noted.   Cardiovascular: Normal heart rate noted  Respiratory: Normal respiratory effort, no problems with respiration noted  Abdomen: Soft, gravid, appropriate for gestational age.  Pain/Pressure: Absent     Pelvic: Cervical exam deferred        Extremities: Normal range of motion.  Edema: Trace  Mental Status: Normal mood and affect. Normal behavior. Normal judgment and thought content.   Assessment and Plan:  Pregnancy: G2P0 at [redacted]w[redacted]d  1. Supervision of normal first pregnancy, antepartum FHT and FH normal  2. Vaginal discharge during pregnancy, antepartum - Cervicovaginal ancillary only( )  3. Hyperemesis gravidarum Controlled with diclegis   Preterm labor symptoms and general obstetric precautions including but not limited to vaginal bleeding, contractions, leaking of fluid and fetal movement were reviewed in detail with the patient. Please refer to After Visit Summary  for other counseling recommendations.  Return in about 4 weeks (around 12/08/2018) for OB f/u, 2 hr GTT.  Future Appointments  Date Time Provider Department Center  12/08/2018  8:45 AM Levie Heritage, DO [facility] None    Levie Heritage, DO

## 2018-11-10 NOTE — Progress Notes (Signed)
Pt c/o vaginal discharge and odor  

## 2018-11-11 ENCOUNTER — Other Ambulatory Visit: Payer: Self-pay | Admitting: Family Medicine

## 2018-11-11 LAB — CERVICOVAGINAL ANCILLARY ONLY
Bacterial vaginitis: POSITIVE — AB
CANDIDA VAGINITIS: POSITIVE — AB

## 2018-11-11 MED ORDER — METRONIDAZOLE 500 MG PO TABS: 500.0000 mg | ORAL_TABLET | Freq: Two times a day (BID) | ORAL | 0 refills | Status: AC

## 2018-11-11 MED ORDER — FLUCONAZOLE 150 MG PO TABS: 150.0000 mg | ORAL_TABLET | Freq: Once | ORAL | 0 refills | Status: AC

## 2018-12-08 ENCOUNTER — Ambulatory Visit (INDEPENDENT_AMBULATORY_CARE_PROVIDER_SITE_OTHER): Payer: Medicaid Other | Admitting: Family Medicine

## 2018-12-08 ENCOUNTER — Other Ambulatory Visit: Payer: Self-pay

## 2018-12-08 VITALS — BP 104/64 | HR 93 | Temp 98.9°F | Wt 184.0 lb

## 2018-12-08 DIAGNOSIS — Z23 Encounter for immunization: Secondary | ICD-10-CM | POA: Diagnosis not present

## 2018-12-08 DIAGNOSIS — Z3A27 27 weeks gestation of pregnancy: Secondary | ICD-10-CM

## 2018-12-08 DIAGNOSIS — O210 Mild hyperemesis gravidarum: Secondary | ICD-10-CM

## 2018-12-08 DIAGNOSIS — Z3400 Encounter for supervision of normal first pregnancy, unspecified trimester: Secondary | ICD-10-CM

## 2018-12-08 NOTE — Progress Notes (Signed)
   PRENATAL VISIT NOTE  Subjective:  Natalie Wang is a 28 y.o. G2P0 at [redacted]w[redacted]d being seen today for ongoing prenatal care.  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  Patient reports no complaints.  Contractions: Not present. Vag. Bleeding: None.  Movement: Present. Denies leaking of fluid.   The following portions of the patient's history were reviewed and updated as appropriate: allergies, current medications, past family history, past medical history, past social history, past surgical history and problem list.   Objective:   Vitals:   12/08/18 0840  BP: 104/64  Pulse: 93  Temp: 98.9 F (37.2 C)  Weight: 184 lb (83.5 kg)    Fetal Status: Fetal Heart Rate (bpm): 144 Fundal Height: 28 cm Movement: Present     General:  Alert, oriented and cooperative. Patient is in no acute distress.  Skin: Skin is warm and dry. No rash noted.   Cardiovascular: Normal heart rate noted  Respiratory: Normal respiratory effort, no problems with respiration noted  Abdomen: Soft, gravid, appropriate for gestational age.  Pain/Pressure: Absent     Pelvic: Cervical exam deferred        Extremities: Normal range of motion.  Edema: Trace  Mental Status: Normal mood and affect. Normal behavior. Normal judgment and thought content.   Assessment and Plan:  Pregnancy: G2P0 at [redacted]w[redacted]d 1. Supervision of normal first pregnancy, antepartum FHT and FH normal. - CBC - Glucose Tolerance, 2 Hours w/1 Hour - HIV Antibody (routine testing w rflx) - RPR  2. Hyperemesis gravidarum Continues to take diclegis - CBC - Glucose Tolerance, 2 Hours w/1 Hour - HIV Antibody (routine testing w rflx) - RPR  Preterm labor symptoms and general obstetric precautions including but not limited to vaginal bleeding, contractions, leaking of fluid and fetal movement  were reviewed in detail with the patient. Please refer to After Visit Summary for other counseling recommendations.   Return in about 4 weeks (around 01/05/2019).  Future Appointments  Date Time Provider Department Center  01/10/2019 10:30 AM Aviva Signs, CNM [facility] None    Levie Heritage, DO

## 2018-12-09 LAB — CBC
HEMATOCRIT: 35.4 % (ref 34.0–46.6)
HEMOGLOBIN: 12.1 g/dL (ref 11.1–15.9)
MCH: 33.9 pg — ABNORMAL HIGH (ref 26.6–33.0)
MCHC: 34.2 g/dL (ref 31.5–35.7)
MCV: 99 fL — AB (ref 79–97)
Platelets: 330 10*3/uL (ref 150–450)
RBC: 3.57 x10E6/uL — AB (ref 3.77–5.28)
RDW: 12.6 % (ref 11.7–15.4)
WBC: 11.3 10*3/uL — ABNORMAL HIGH (ref 3.4–10.8)

## 2018-12-09 LAB — GLUCOSE TOLERANCE, 2 HOURS W/ 1HR
Glucose, 1 hour: 170 mg/dL (ref 65–179)
Glucose, 2 hour: 132 mg/dL (ref 65–152)
Glucose, Fasting: 84 mg/dL (ref 65–91)

## 2018-12-09 LAB — HIV ANTIBODY (ROUTINE TESTING W REFLEX): HIV Screen 4th Generation wRfx: NONREACTIVE

## 2018-12-09 LAB — RPR: RPR Ser Ql: NONREACTIVE

## 2019-01-10 ENCOUNTER — Ambulatory Visit (INDEPENDENT_AMBULATORY_CARE_PROVIDER_SITE_OTHER): Payer: Medicaid Other | Admitting: Advanced Practice Midwife

## 2019-01-10 ENCOUNTER — Encounter: Payer: Self-pay | Admitting: Advanced Practice Midwife

## 2019-01-10 ENCOUNTER — Other Ambulatory Visit: Payer: Self-pay

## 2019-01-10 VITALS — BP 114/67 | HR 88 | Wt 189.0 lb

## 2019-01-10 DIAGNOSIS — Z3403 Encounter for supervision of normal first pregnancy, third trimester: Secondary | ICD-10-CM

## 2019-01-10 DIAGNOSIS — B009 Herpesviral infection, unspecified: Secondary | ICD-10-CM

## 2019-01-10 DIAGNOSIS — Z3400 Encounter for supervision of normal first pregnancy, unspecified trimester: Secondary | ICD-10-CM

## 2019-01-10 NOTE — Progress Notes (Signed)
   PRENATAL VISIT NOTE  Subjective:  Natalie Wang is a 28 y.o. G2P0 at [redacted]w[redacted]d being seen today for ongoing prenatal care.  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  Patient reports no complaints.  Contractions: Not present. Vag. Bleeding: None.  Movement: Present. Denies leaking of fluid.   The following portions of the patient's history were reviewed and updated as appropriate: allergies, current medications, past family history, past medical history, past social history, past surgical history and problem list.   Objective:   Vitals:   01/10/19 1036  BP: 114/67  Pulse: 88  Weight: 85.7 kg    Fetal Status: Fetal Heart Rate (bpm): 140 Fundal Height: 33 cm Movement: Present     General:  Alert, oriented and cooperative. Patient is in no acute distress.  Skin: Skin is warm and dry. No rash noted.   Cardiovascular: Normal heart rate noted  Respiratory: Normal respiratory effort, no problems with respiration noted  Abdomen: Soft, gravid, appropriate for gestational age.  Pain/Pressure: Absent     Pelvic: Cervical exam deferred        Extremities: Normal range of motion.  Edema: Trace  Mental Status: Normal mood and affect. Normal behavior. Normal judgment and thought content.   Assessment and Plan:  Pregnancy: G2P0 at [redacted]w[redacted]d 1. 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 Please refer to After Visit Summary for other counseling recommendations.   Return in about 2 weeks (around 01/24/2019).  Future Appointments  Date Time Provider Department Center  01/26/2019  2:15 PM Willodean Rosenthal, MD [facility] None  02/09/2019 10:15 AM Adrian Blackwater Rhona Raider, DO [facility] None    Wynelle Bourgeois, CNM

## 2019-01-10 NOTE — Patient Instructions (Signed)

## 2019-01-14 ENCOUNTER — Inpatient Hospital Stay (HOSPITAL_COMMUNITY)
Admission: AD | Admit: 2019-01-14 | Discharge: 2019-01-14 | Disposition: A | Discharge status: Home / Self Care | Payer: Medicaid Other | Attending: Obstetrics and Gynecology | Admitting: Obstetrics and Gynecology

## 2019-01-14 ENCOUNTER — Other Ambulatory Visit: Payer: Self-pay

## 2019-01-14 DIAGNOSIS — Z79899 Other long term (current) drug therapy: Secondary | ICD-10-CM | POA: Diagnosis not present

## 2019-01-14 DIAGNOSIS — Z3A32 32 weeks gestation of pregnancy: Secondary | ICD-10-CM | POA: Diagnosis not present

## 2019-01-14 DIAGNOSIS — O99513 Diseases of the respiratory system complicating pregnancy, third trimester: Secondary | ICD-10-CM | POA: Insufficient documentation

## 2019-01-14 DIAGNOSIS — G5600 Carpal tunnel syndrome, unspecified upper limb: Secondary | ICD-10-CM | POA: Insufficient documentation

## 2019-01-14 DIAGNOSIS — O26893 Other specified pregnancy related conditions, third trimester: Secondary | ICD-10-CM

## 2019-01-14 DIAGNOSIS — O99353 Diseases of the nervous system complicating pregnancy, third trimester: Secondary | ICD-10-CM | POA: Diagnosis not present

## 2019-01-14 DIAGNOSIS — J45909 Unspecified asthma, uncomplicated: Secondary | ICD-10-CM | POA: Diagnosis not present

## 2019-01-14 DIAGNOSIS — Z87891 Personal history of nicotine dependence: Secondary | ICD-10-CM | POA: Diagnosis not present

## 2019-01-14 DIAGNOSIS — O26899 Other specified pregnancy related conditions, unspecified trimester: Secondary | ICD-10-CM

## 2019-01-14 MED ORDER — OXYCODONE-ACETAMINOPHEN 5-325 MG PO TABS: 1.0000 | ORAL_TABLET | Freq: Four times a day (QID) | ORAL | 0 refills | Status: DC | PRN

## 2019-01-14 NOTE — Discharge Instructions (Signed)
Carpal Tunnel Syndrome  Carpal tunnel syndrome is a condition that causes pain in your hand and arm. The carpal tunnel is a narrow area that is on the palm side of your wrist. Repeated wrist motion or certain diseases may cause swelling in the tunnel. This swelling can pinch the main nerve in the wrist (median nerve). What are the causes? This condition may be caused by:  Repeated wrist motions.  Wrist injuries.  Arthritis.  A sac of fluid (cyst) or abnormal growth (tumor) in the carpal tunnel.  Fluid buildup during pregnancy. Sometimes the cause is not known. What increases the risk? The following factors may make you more likely to develop this condition:  Having a job in which you move your wrist in the same way many times. This includes jobs like being a Midwife or a Conservation officer, nature.  Being a woman.  Having other health conditions, such as: ? Diabetes. ? Obesity. ? A thyroid gland that is not active enough (hypothyroidism). ? Kidney failure. What are the signs or symptoms? Symptoms of this condition include:  A tingling feeling in your fingers.  Tingling or a loss of feeling (numbness) in your hand.  Pain in your entire arm. This pain may get worse when you bend your wrist and elbow for a long time.  Pain in your wrist that goes up your arm to your shoulder.  Pain that goes down into your palm or fingers.  A weak feeling in your hands. You may find it hard to grab and hold items. You may feel worse at night. How is this diagnosed? This condition is diagnosed with a medical history and physical exam. You may also have tests, such as:  Electromyogram (EMG). This test checks the signals that the nerves send to the muscles.  Nerve conduction study. This test checks how well signals pass through your nerves.  Imaging tests, such as X-rays, ultrasound, and MRI. These tests check for what might be the cause of your condition. How is this treated? This condition may be treated  with:  Lifestyle changes. You will be asked to stop or change the activity that caused your problem.  Doing exercise and activities that make bones and muscles stronger (physical therapy).  Learning how to use your hand again (occupational therapy).  Medicines for pain and swelling (inflammation). You may have injections in your wrist.  A wrist splint.  Surgery. Follow these instructions at home: If you have a splint:  Wear the splint as told by your doctor. Remove it only as told by your doctor.  Loosen the splint if your fingers: ? Tingle. ? Lose feeling (become numb). ? Turn cold and blue.  Keep the splint clean.  If the splint is not waterproof: ? Do not let it get wet. ? Cover it with a watertight covering when you take a bath or a shower. Managing pain, stiffness, and swelling   If told, put ice on the painful area: ? If you have a removable splint, remove it as told by your doctor. ? Put ice in a plastic bag. ? Place a towel between your skin and the bag. ? Leave the ice on for 20 minutes, 2-3 times per day. General instructions  Take over-the-counter and prescription medicines only as told by your doctor.  Rest your wrist from any activity that may cause pain. If needed, talk with your boss at work about changes that can help your wrist heal.  Do any exercises as told by your doctor,  physical therapist, or occupational therapist.  Keep all follow-up visits as told by your doctor. This is important. Contact a doctor if:  You have new symptoms.  Medicine does not help your pain.  Your symptoms get worse. Get help right away if:  You have very bad numbness or tingling in your wrist or hand. Summary  Carpal tunnel syndrome is a condition that causes pain in your hand and arm.  It is often caused by repeated wrist motions.  Lifestyle changes and medicines are used to treat this problem. Surgery may help in very bad cases.  Follow your doctor's  instructions about wearing a splint, resting your wrist, keeping follow-up visits, and calling for help. This information is not intended to replace advice given to you by your health care provider. Make sure you discuss any questions you have with your health care provider. Document Released: 08/20/2011 Document Revised: 01/07/2018 Document Reviewed: 01/07/2018 Elsevier Interactive Patient Education  2019 [employer]   Carpal Tunnel Syndrome  Carpal tunnel syndrome is a condition that causes pain in your hand and arm. The carpal tunnel is a narrow area located on the palm side of your wrist. Repeated wrist motion or certain diseases may cause swelling within the tunnel. This swelling pinches the main nerve in the wrist (median nerve). What are the causes? This condition may be caused by:  Repeated wrist motions.  Wrist injuries.  Arthritis.  A cyst or tumor in the carpal tunnel.  Fluid buildup during pregnancy. Sometimes the cause of this condition is not known. What increases the risk? The following factors may make you more likely to develop this condition:  Having a job, such as being a Haematologistbutcher or a cashier, that requires you to repeatedly move your wrist in the same motion.  Being a woman.  Having certain conditions, such as: ? Diabetes. ? Obesity. ? An underactive thyroid (hypothyroidism). ? Kidney failure. What are the signs or symptoms? Symptoms of this condition include:  A tingling feeling in your fingers, especially in your thumb, index, and middle fingers.  Tingling or numbness in your hand.  An aching feeling in your entire arm, especially when your wrist and elbow are bent for a long time.  Wrist pain that goes up your arm to your shoulder.  Pain that goes down into your palm or fingers.  A weak feeling in your hands. You may have trouble grabbing and holding items. Your symptoms may feel worse during the night. How is this diagnosed? This condition  is diagnosed with a medical history and physical exam. You may also have tests, including:  Electromyogram (EMG). This test measures electrical signals sent by your nerves into the muscles.  Nerve conduction study. This test measures how well electrical signals pass through your nerves.  Imaging tests, such as X-rays, ultrasound, and MRI. These tests check for possible causes of your condition. How is this treated? This condition may be treated with:  Lifestyle changes. It is important to stop or change the activity that caused your condition.  Doing exercise and activities to strengthen your muscles and bones (physical therapy).  Learning how to use your hand again after diagnosis (occupational therapy).  Medicines for pain and inflammation. This may include medicine that is injected into your wrist.  A wrist splint.  Surgery. Follow these instructions at home: If you have a splint:  Wear the splint as told by your health care provider. Remove it only as told by your health care provider.  Loosen the splint if your fingers tingle, become numb, or turn cold and blue.  Keep the splint clean.  If the splint is not waterproof: ? Do not let it get wet. ? Cover it with a watertight covering when you take a bath or shower. Managing pain, stiffness, and swelling   If directed, put ice on the painful area: ? If you have a removable splint, remove it as told by your health care provider. ? Put ice in a plastic bag. ? Place a towel between your skin and the bag. ? Leave the ice on for 20 minutes, 2-3 times per day. General instructions  Take over-the-counter and prescription medicines only as told by your health care provider.  Rest your wrist from any activity that may be causing your pain. If your condition is work related, talk with your employer about changes that can be made, such as getting a wrist pad to use while typing.  Do any exercises as told by your health care  provider, physical therapist, or occupational therapist.  Keep all follow-up visits as told by your health care provider. This is important. Contact a health care provider if:  You have new symptoms.  Your pain is not controlled with medicines.  Your symptoms get worse. Get help right away if:  You have severe numbness or tingling in your wrist or hand. Summary  Carpal tunnel syndrome is a condition that causes pain in your hand and arm.  It is usually caused by repeated wrist motions.  Lifestyle changes and medicines are used to treat carpal tunnel syndrome. Surgery may be recommended.  Follow your health care provider's instructions about wearing a splint, resting from activity, keeping follow-up visits, and calling for help. This information is not intended to replace advice given to you by your health care provider. Make sure you discuss any questions you have with your health care provider. Document Released: 08/28/2000 Document Revised: 01/07/2018 Document Reviewed: 01/07/2018 Elsevier Interactive Patient Education  2019 [employer].

## 2019-01-14 NOTE — MAU Note (Signed)
Natalie Wang is a 28 y.o. at [redacted]w[redacted]d here in MAU reporting: reporting carpal tunnel pain in both hands and up both arms, has been ongoing for a month. States she was wearing a wrist brace and that helped a little but it is not helping anymore. No abdominal pain, LOF, or vag bleeding. + FM  Onset of complaint: a month  Pain score: 10/10  Vitals:   01/14/19 1124  BP: 111/63  Pulse: 89  Resp: 18  Temp: 98.5 F (36.9 C)  SpO2: 98%     FHT:134  Lab orders placed from triage: none

## 2019-01-14 NOTE — MAU Provider Note (Signed)
History     CSN: [id number]  Arrival date and time: 01/14/19 1056   None     Chief Complaint  Patient presents with  . Carpal Tunnel   HPI   [name] is a 28 y.o. female G2P0 @ [redacted]w[redacted]d here in MAU with carpal tunnel pain in both of her wrists. This has been an on-going issue. The pain is worse in her right hand than left. She has not tried any medications however is wearing a brace which does not seem to help much. She has no pregnancy complaints today. + fetal movement. No bleeding or leaking. States her husband has noticed swelling in her feet however the patient is not concerned about it. She was told by her OB Dr. In [facility] that she may need injections in her wrist to help the discomfort.   Patient upset and tearful in triage d/t the discomfort she is experiencing in her wrists.   OB History    Gravida  2   Para      Term      Preterm      AB      Living        SAB      TAB      Ectopic      Multiple      Live Births              Past Medical History:  Diagnosis Date  . Anxiety   . Asthma   . Herpes     Past Surgical History:  Procedure Laterality Date  . APPENDECTOMY      Family History  Problem Relation Age of Onset  . Cancer Neg Hx   . Hypertension Neg Hx     Social History   Tobacco Use  . Smoking status: Former Smoker    Last attempt to quit: 02/27/2018    Years since quitting: 0.8  . Smokeless tobacco: Never Used  Substance Use Topics  . Alcohol use: Not Currently  . Drug use: Not Currently    Allergies: No Known Allergies  Medications Prior to Admission  Medication Sig Dispense Refill Last Dose  . albuterol (PROVENTIL HFA;VENTOLIN HFA) 108 (90 Base) MCG/ACT inhaler Inhale 1-2 puffs into the lungs every 6 (six) hours as needed for wheezing or shortness of breath.   Taking  . Doxylamine-Pyridoxine (DICLEGIS) 10-10 MG TBEC Take 2 tablets by mouth at bedtime. If symptoms persist, add one tablet in the morning and one  in the afternoon (Patient not taking: Reported on 12/08/2018) 100 tablet 5 Not Taking  . Omega-3 Fatty Acids (FISH OIL) 500 MG CAPS Take 300 mg by mouth.   Taking  . Prenatal Vit-Fe Fumarate-FA (PRENATAL MULTIVITAMIN) TABS tablet Take 1 tablet by mouth daily at 12 noon.   Taking  . valACYclovir (VALTREX) 500 MG tablet Take 500 mg by mouth daily.   Taking   No results found for this or any previous visit (from the past 48 hour(s)).  Review of Systems  Gastrointestinal: Negative for abdominal pain.  Genitourinary: Negative for vaginal bleeding and vaginal discharge.  Musculoskeletal: Positive for arthralgias and joint swelling.   Physical Exam   Blood pressure 111/63, pulse 89, temperature 98.5 F (36.9 C), temperature source Oral, resp. rate 18, height 5\' 6"  (1.676 m), weight 85.5 kg, last menstrual period 05/31/2018, SpO2 98 %, unknown if currently breastfeeding.  Physical Exam  Constitutional: She is oriented to person, place, and time. She appears well-developed and well-nourished.  No distress.  HENT:  Head: Normocephalic.  Eyes: Pupils are equal, round, and reactive to light.  Musculoskeletal:     Right wrist: She exhibits decreased range of motion, tenderness and swelling.     Left wrist: She exhibits decreased range of motion, tenderness and swelling.  Neurological: She is alert and oriented to person, place, and time.  Skin: Skin is warm. She is not diaphoretic.  Psychiatric: Her behavior is normal.    MAU Course  Procedures  None  MDM + fetal movement No OB complaints today + fetal heart tones via doppler Discussed patient with Dr. Jolayne Pantheronstant; reviewed treatment options for outpatient management of carpel tunnel. I will given the patient a short course of percocet and send a high priority message to [facility] for an appointment early next week with Dr. Adrian BlackwaterStinson to discuss injections.   Assessment and Plan   A:  1. Carpal tunnel syndrome during pregnancy      P:  Discharge home in stable condition Rx: Percocet #15 Continue ice, continue wearing brace Office to call her to schedule appointment  Rasch, Harolyn RutherfordJennifer I, NP 01/14/2019 1:39 PM

## 2019-01-16 ENCOUNTER — Other Ambulatory Visit: Payer: Self-pay | Admitting: Family Medicine

## 2019-01-16 DIAGNOSIS — O26899 Other specified pregnancy related conditions, unspecified trimester: Principal | ICD-10-CM

## 2019-01-16 DIAGNOSIS — O26891 Other specified pregnancy related conditions, first trimester: Secondary | ICD-10-CM

## 2019-01-16 DIAGNOSIS — O209 Hemorrhage in early pregnancy, unspecified: Secondary | ICD-10-CM

## 2019-01-16 DIAGNOSIS — O208 Other hemorrhage in early pregnancy: Secondary | ICD-10-CM

## 2019-01-16 DIAGNOSIS — O200 Threatened abortion: Secondary | ICD-10-CM

## 2019-01-16 DIAGNOSIS — R109 Unspecified abdominal pain: Principal | ICD-10-CM

## 2019-01-16 DIAGNOSIS — G5600 Carpal tunnel syndrome, unspecified upper limb: Secondary | ICD-10-CM

## 2019-01-16 NOTE — Progress Notes (Signed)
Patient has worsening carpal tunnel symptoms. Went to MAU. Will refer to Sports medicine to investigate injection.

## 2019-01-17 ENCOUNTER — Other Ambulatory Visit: Payer: Self-pay

## 2019-01-17 ENCOUNTER — Encounter: Payer: Self-pay | Admitting: Family Medicine

## 2019-01-17 ENCOUNTER — Ambulatory Visit (INDEPENDENT_AMBULATORY_CARE_PROVIDER_SITE_OTHER): Payer: Medicaid Other | Admitting: Family Medicine

## 2019-01-17 ENCOUNTER — Ambulatory Visit: Payer: Self-pay

## 2019-01-17 VITALS — BP 111/64 | HR 96 | Ht 66.0 in | Wt 189.0 lb

## 2019-01-17 DIAGNOSIS — M25531 Pain in right wrist: Secondary | ICD-10-CM | POA: Diagnosis not present

## 2019-01-17 MED ORDER — METHYLPREDNISOLONE ACETATE 40 MG/ML IJ SUSP
40.0000 mg | Freq: Once | INTRAMUSCULAR | Status: AC
  Administered 2019-01-17: 40 mg via INTRA_ARTICULAR

## 2019-01-17 MED ORDER — VALACYCLOVIR HCL 500 MG PO TABS: 500.0000 mg | ORAL_TABLET | Freq: Two times a day (BID) | ORAL | 3 refills | Status: DC

## 2019-01-17 MED ORDER — ALBUTEROL SULFATE HFA 108 (90 BASE) MCG/ACT IN AERS: 1.0000 | INHALATION_SPRAY | Freq: Four times a day (QID) | RESPIRATORY_TRACT | 3 refills | Status: DC | PRN

## 2019-01-17 NOTE — Patient Instructions (Signed)
Nice to meet you  Please try the exercises for the wrists and the scapular exercises Please continue to wear the braces at night  Please send me a message in MyChart with any questions or updates.

## 2019-01-17 NOTE — Progress Notes (Signed)
Rie Wendler - 28 y.o. female MRN [id number]  Date of birth: July 03, 1991  SUBJECTIVE:  Including CC & ROS.  Chief Complaint  Patient presents with  . Hand Pain    bilateral hand    Tajai Dalessandro is a 28 y.o. female that is presenting with right and left hand pain. Symptoms have been ongoing during her pregnancy. Having palmar numbness in the first three digits. Right is worse than left. She also has pain that radiates up the right arm and into the scapular. Pain is worse at night. Has been taking tylenol. 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 Has tried bracing on each wrist and little improvement. Has been wearing the bracing at night.    Review of Systems  Constitutional: Negative for fever.  HENT: Negative for congestion.   Respiratory: Negative for cough.   Cardiovascular: Negative for chest pain.  Gastrointestinal: Negative for abdominal distention.  Musculoskeletal: Positive for back pain.  Skin: Negative for color change.  Neurological: Positive for numbness.  Hematological: Negative for adenopathy.    HISTORY: Past Medical, Surgical, Social, and Family History Reviewed & Updated per EMR.   Pertinent Historical Findings include:  Past Medical History:  Diagnosis Date  . Anxiety   . Asthma   . Herpes     Past Surgical History:  Procedure Laterality Date  . APPENDECTOMY      No Known Allergies  Family History  Problem Relation Age of Onset  . Cancer Neg Hx   . Hypertension Neg Hx      Social History   Socioeconomic History  . Marital status: Married    Spouse name: Not on file  . Number of children: Not on file  . Years of education: Not on file  . Highest education level: Not on file  Occupational History  . Not on file  Social Needs  . Financial resource strain: Not on file  . Food insecurity:    Worry: Not on file    Inability: Not on file  . Transportation needs:    Medical: Not on file    Non-medical: Not on file  Tobacco Use  .  Smoking status: Former Smoker    Last attempt to quit: 02/27/2018    Years since quitting: 0.8  . Smokeless tobacco: Never Used  Substance and Sexual Activity  . Alcohol use: Not Currently  . Drug use: Not Currently  . Sexual activity: Yes    Birth control/protection: None  Lifestyle  . Physical activity:    Days per week: Not on file    Minutes per session: Not on file  . Stress: Not on file  Relationships  . Social connections:    Talks on phone: Not on file    Gets together: Not on file    Attends religious service: Not on file    Active member of club or organization: Not on file    Attends meetings of clubs or organizations: Not on file    Relationship status: Not on file  . Intimate partner violence:    Fear of current or ex partner: Not on file    Emotionally abused: Not on file    Physically abused: Not on file    Forced sexual activity: Not on file  Other Topics Concern  . Not on file  Social History Narrative  . Not on file     PHYSICAL EXAM:  VS: BP 111/64   Pulse 96   Ht 5\' 6"  (1.676 m)  Wt 189 lb (85.7 kg)   LMP 05/31/2018 (Exact Date)   BMI 30.51 kg/m  Physical Exam Gen: NAD, alert, cooperative with exam, well-appearing ENT: normal lips, normal nasal mucosa,  Eye: normal EOM, normal conjunctiva and lids CV:  no edema, +2 pedal pulses   Resp: no accessory muscle use, non-labored,   Skin: no rashes, no areas of induration  Neuro: normal tone, normal sensation to touch Psych:  normal insight, alert and oriented MSK:  Right arm/hand:  No specific area of tenderness  Normal elbow and wrist ROM  Normal grip strength  No signs of atrophy  Negative Tinel's at the wrist  Negative Tinel's at the elbow  Left arm/hand: No specific area of tenderness  Normal elbow and wrist ROM  No signs of atrophy  Negative Tinel's at the wrist  Negative Tinel's at the elbow  Neurovascularly intact   Limited ultrasound: Right wrist/left wrist:  Right wrist:   Median nerve appears to be normal in dynamic testing.  Appears to be normal measurement.   Left wist:  Median nerve appears to be normal in dynamic testing  Appears to be normal measurement.   Summary: findings are suggestive of normal measurements.   Ultrasound and interpretation by Clare GandyJeremy Ciearra Rufo, MD    Aspiration/Injection Procedure Note Marin CommentShaylene Tootle 08/01/1991  Procedure: Injection Indications: Right carpal tunnel  Procedure Details Consent: Risks of procedure as well as the alternatives and risks of each were explained to the (patient/caregiver).  Consent for procedure obtained. Time Out: Verified patient identification, verified procedure, site/side was marked, verified correct patient position, special equipment/implants available, medications/allergies/relevent history reviewed, required imaging and test results available.  Performed.  The area was cleaned with iodine and alcohol swabs.    The right carpal tunnel was injected using 1 cc's of 40 mg depo-deprol and 1 cc's of 0.5% bupivacaine with a 25 1 1/2" needle.  Ultrasound was used. Images were obtained in long views showing the injection.     A sterile dressing was applied.  Patient did tolerate procedure well.       ASSESSMENT & PLAN:   Right wrist pain Symptoms in the hand could be associated with carpal tunnel. Radiation up the arm is more radicular in nature.  - carpal tunnel injection on right. Can try on left if gets improvement  - will continue bracing  - counseled on HEP and supportive. Will try to focus on posture to help with radicular symptoms.  - can try injection on left. Could consider physical therapy.

## 2019-01-17 NOTE — Assessment & Plan Note (Signed)
Symptoms in the hand could be associated with carpal tunnel. Radiation up the arm is more radicular in nature.  - carpal tunnel injection on right. Can try on left if gets improvement  - will continue bracing  - counseled on HEP and supportive. Will try to focus on posture to help with radicular symptoms.  - can try injection on left. Could consider physical therapy.

## 2019-01-26 ENCOUNTER — Encounter: Payer: Self-pay | Admitting: Obstetrics & Gynecology

## 2019-01-26 ENCOUNTER — Ambulatory Visit (INDEPENDENT_AMBULATORY_CARE_PROVIDER_SITE_OTHER): Payer: Medicaid Other | Admitting: Obstetrics & Gynecology

## 2019-01-26 VITALS — BP 114/68 | HR 87 | Wt 189.0 lb

## 2019-01-26 DIAGNOSIS — O210 Mild hyperemesis gravidarum: Secondary | ICD-10-CM

## 2019-01-26 DIAGNOSIS — Z3400 Encounter for supervision of normal first pregnancy, unspecified trimester: Secondary | ICD-10-CM

## 2019-01-26 DIAGNOSIS — Z3A34 34 weeks gestation of pregnancy: Secondary | ICD-10-CM

## 2019-01-26 DIAGNOSIS — B009 Herpesviral infection, unspecified: Secondary | ICD-10-CM

## 2019-01-26 NOTE — Progress Notes (Signed)
   TELEHEALTH VIRTUAL OBSTETRICS PRENATAL VISIT ENCOUNTER NOTE  I connected with Natalie Wang on 01/26/19 at  2:15 PM EDT by WebEx at home and verified that I am speaking with the correct person using two identifiers.   I discussed the limitations, risks, security and privacy concerns of performing an evaluation and management service by telephone and the availability of in person appointments. I also discussed with the patient that there may be a patient responsible charge related to this service. The patient expressed understanding and agreed to proceed. Subjective:  Natalie Wang is a 28 y.o. G2P0 at [redacted]w[redacted]d being seen today for ongoing prenatal care.  She is currently monitored for the following issues for this low-risk pregnancy and has Supervision of normal first pregnancy, antepartum; Hyperemesis gravidarum; HSV-2 infection; and Right wrist pain on their problem list.  Patient reports no complaints.  Reports fetal movement. Contractions: Not present. Vag. Bleeding: None.  Movement: Present. Denies any contractions, bleeding or leaking of fluid.   The following portions of the patient's history were reviewed and updated as appropriate: allergies, current medications, past family history, past medical history, past social history, past surgical history and problem list.   Objective:   Vitals:   01/26/19 1415  BP: 114/68  Pulse: 87  Weight: 189 lb (85.7 kg)    Fetal Status:     Movement: Present     General:  Alert, oriented and cooperative. Patient is in no acute distress.  Respiratory: Normal respiratory effort, no problems with respiration noted  Mental Status: Normal mood and affect. Normal behavior. Normal judgment and thought content.  Rest of physical exam deferred due to type of encounter  Assessment and Plan:  Pregnancy: G2P0 at [redacted]w[redacted]d 1. Supervision of normal first pregnancy, antepartum cx and GBS next visit   2. Hyperemesis gravidarum Improved. Still on Diclegis at  night   3. HSV-2 infection Valtrex bid   Preterm labor symptoms and general obstetric precautions including but not limited to vaginal bleeding, contractions, leaking of fluid and fetal movement were reviewed in detail with the patient. I discussed the assessment and treatment plan with the patient. The patient was provided an opportunity to ask questions and all were answered. The patient agreed with the plan and demonstrated an understanding of the instructions. The patient was advised to call back or seek an in-person office evaluation/go to MAU at [facility] for any urgent or concerning symptoms. Please refer to After Visit Summary for other counseling recommendations.   I provided 12 minutes of face-to-face via WebEx time during this encounter.  Return in about 2 weeks (around 02/09/2019) for in ofc.  Future Appointments  Date Time Provider Department Center  02/09/2019 10:15 AM Levie Heritage, DO [facility] None    Willodean Rosenthal, MD Center for [facility], [facility]

## 2019-01-26 NOTE — Progress Notes (Signed)
Patient identified with name and dob.  Patient agrees to webex visit. Armandina Stammer RN

## 2019-02-09 ENCOUNTER — Other Ambulatory Visit (HOSPITAL_COMMUNITY)
Admission: RE | Admit: 2019-02-09 | Discharge: 2019-02-09 | Disposition: A | Discharge status: Home / Self Care | Payer: Medicaid Other | Source: Ambulatory Visit | Attending: Family Medicine | Admitting: Family Medicine

## 2019-02-09 ENCOUNTER — Ambulatory Visit (INDEPENDENT_AMBULATORY_CARE_PROVIDER_SITE_OTHER): Payer: Medicaid Other | Admitting: Family Medicine

## 2019-02-09 ENCOUNTER — Other Ambulatory Visit: Payer: Self-pay

## 2019-02-09 VITALS — BP 117/67 | HR 79 | Wt 197.0 lb

## 2019-02-09 DIAGNOSIS — Z3400 Encounter for supervision of normal first pregnancy, unspecified trimester: Secondary | ICD-10-CM

## 2019-02-09 DIAGNOSIS — B009 Herpesviral infection, unspecified: Secondary | ICD-10-CM | POA: Insufficient documentation

## 2019-02-09 DIAGNOSIS — O210 Mild hyperemesis gravidarum: Secondary | ICD-10-CM

## 2019-02-09 DIAGNOSIS — Z3A36 36 weeks gestation of pregnancy: Secondary | ICD-10-CM

## 2019-02-09 LAB — OB RESULTS CONSOLE GC/CHLAMYDIA: Gonorrhea: NEGATIVE

## 2019-02-09 LAB — OB RESULTS CONSOLE GBS: GBS: NEGATIVE

## 2019-02-09 NOTE — Progress Notes (Signed)
   PRENATAL VISIT NOTE  Subjective:  Natalie Wang is a 28 y.o. G2P0 at [redacted]w[redacted]d being seen today for ongoing prenatal care.  She is currently monitored for the following issues for this low-risk pregnancy and has Supervision of normal first pregnancy, antepartum; Hyperemesis gravidarum; HSV-2 infection; and Right wrist pain on their problem list.  Patient reports occasional contractions.  Contractions: Irregular. Vag. Bleeding: None.  Movement: Present. Denies leaking of fluid.   The following portions of the patient's history were reviewed and updated as appropriate: allergies, current medications, past family history, past medical history, past social history, past surgical history and problem list.   Objective:   Vitals:   02/09/19 1019  BP: 117/67  Pulse: 79  Weight: 197 lb (89.4 kg)    Fetal Status: Fetal Heart Rate (bpm): 145 Fundal Height: 37 cm Movement: Present  Presentation: Vertex  General:  Alert, oriented and cooperative. Patient is in no acute distress.  Skin: Skin is warm and dry. No rash noted.   Cardiovascular: Normal heart rate noted  Respiratory: Normal respiratory effort, no problems with respiration noted  Abdomen: Soft, gravid, appropriate for gestational age.  Pain/Pressure: Present     Pelvic: Cervical exam performed Dilation: Fingertip Effacement (%): Thick Station: Ballotable  Extremities: Normal range of motion.  Edema: Trace  Mental Status: Normal mood and affect. Normal behavior. Normal judgment and thought content.   Assessment and Plan:  Pregnancy: G2P0 at [redacted]w[redacted]d 1. Supervision of normal first pregnancy, antepartum FHT and FH normal  2. Hyperemesis gravidarum   3. 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 Please refer to After Visit Summary for other  counseling recommendations.   Return in about 2 weeks (around 02/23/2019) for OB f/u, In Office.  No future appointments.  Levie Heritage, DO

## 2019-02-09 NOTE — Addendum Note (Signed)
Addended by: Lorelle Gibbs L on: 02/09/2019 11:14 AM   Modules accepted: Orders

## 2019-02-10 LAB — GC/CHLAMYDIA PROBE AMP (~~LOC~~) NOT AT ARMC
Chlamydia: NEGATIVE
Neisseria Gonorrhea: NEGATIVE

## 2019-02-13 LAB — CULTURE, BETA STREP (GROUP B ONLY): Strep Gp B Culture: NEGATIVE

## 2019-02-23 ENCOUNTER — Ambulatory Visit (INDEPENDENT_AMBULATORY_CARE_PROVIDER_SITE_OTHER): Payer: Medicaid Other | Admitting: Obstetrics & Gynecology

## 2019-02-23 ENCOUNTER — Encounter: Payer: Self-pay | Admitting: Obstetrics & Gynecology

## 2019-02-23 ENCOUNTER — Other Ambulatory Visit: Payer: Self-pay

## 2019-02-23 VITALS — BP 125/70 | HR 79 | Wt 201.0 lb

## 2019-02-23 DIAGNOSIS — Z3400 Encounter for supervision of normal first pregnancy, unspecified trimester: Secondary | ICD-10-CM

## 2019-02-23 DIAGNOSIS — Z3A38 38 weeks gestation of pregnancy: Secondary | ICD-10-CM

## 2019-02-23 DIAGNOSIS — O210 Mild hyperemesis gravidarum: Secondary | ICD-10-CM

## 2019-02-23 NOTE — Progress Notes (Signed)
   PRENATAL VISIT NOTE  Subjective:  Natalie Wang is a 28 y.o. G2P0 at [redacted]w[redacted]d being seen today for ongoing prenatal care.  She is currently monitored for the following issues for this low-risk pregnancy and has Supervision of normal first pregnancy, antepartum; Hyperemesis gravidarum; HSV-2 infection; and Right wrist pain on their problem list.  Patient reports occasional contractions.  Contractions: Irregular. Vag. Bleeding: None.  Movement: Present. Denies leaking of fluid.   The following portions of the patient's history were reviewed and updated as appropriate: allergies, current medications, past family history, past medical history, past social history, past surgical history and problem list.   Objective:   Vitals:   02/23/19 1029  BP: 125/70  Pulse: 79  Weight: 201 lb (91.2 kg)    Fetal Status:     Movement: Present     General:  Alert, oriented and cooperative. Patient is in no acute distress.  Skin: Skin is warm and dry. No rash noted.   Cardiovascular: Normal heart rate noted  Respiratory: Normal respiratory effort, no problems with respiration noted  Abdomen: Soft, gravid, appropriate for gestational age.  Pain/Pressure: Present     Pelvic: Cervical exam performed        Extremities: Normal range of motion.  Edema: Trace  Mental Status: Normal mood and affect. Normal behavior. Normal judgment and thought content.   Assessment and Plan:  Pregnancy: G2P0 at [redacted]w[redacted]d 1. Supervision of normal first pregnancy, antepartum FHR and FH are WNL S/p pelvic exam and stripping.    2. Hyperemesis gravidarum improved  Term labor symptoms and general obstetric precautions including but not limited to vaginal bleeding, contractions, leaking of fluid and fetal movement were reviewed in detail with the patient. Please refer to After Visit Summary for other counseling recommendations.   No follow-ups on file.  Future Appointments  Date Time Provider [place]   03/06/2019 11:00 AM Truett Mainland, DO [facility] None    Lavonia Drafts, MD

## 2019-03-03 ENCOUNTER — Inpatient Hospital Stay (HOSPITAL_COMMUNITY): Payer: Medicaid Other | Admitting: Anesthesiology

## 2019-03-03 ENCOUNTER — Encounter (HOSPITAL_COMMUNITY): Payer: Self-pay | Admitting: *Deleted

## 2019-03-03 ENCOUNTER — Inpatient Hospital Stay (HOSPITAL_COMMUNITY)
Admission: AD | Admit: 2019-03-03 | Discharge: 2019-03-06 | DRG: 807 | Disposition: A | Discharge status: Home / Self Care | Payer: Medicaid Other | Attending: Obstetrics and Gynecology | Admitting: Obstetrics and Gynecology

## 2019-03-03 ENCOUNTER — Other Ambulatory Visit: Payer: Self-pay

## 2019-03-03 DIAGNOSIS — Z30430 Encounter for insertion of intrauterine contraceptive device: Secondary | ICD-10-CM

## 2019-03-03 DIAGNOSIS — O9832 Other infections with a predominantly sexual mode of transmission complicating childbirth: Secondary | ICD-10-CM | POA: Diagnosis present

## 2019-03-03 DIAGNOSIS — Z975 Presence of (intrauterine) contraceptive device: Secondary | ICD-10-CM

## 2019-03-03 DIAGNOSIS — Z3A39 39 weeks gestation of pregnancy: Secondary | ICD-10-CM

## 2019-03-03 DIAGNOSIS — Z1159 Encounter for screening for other viral diseases: Secondary | ICD-10-CM | POA: Diagnosis not present

## 2019-03-03 DIAGNOSIS — Z87891 Personal history of nicotine dependence: Secondary | ICD-10-CM | POA: Diagnosis not present

## 2019-03-03 DIAGNOSIS — D649 Anemia, unspecified: Secondary | ICD-10-CM | POA: Diagnosis present

## 2019-03-03 DIAGNOSIS — O165 Unspecified maternal hypertension, complicating the puerperium: Secondary | ICD-10-CM

## 2019-03-03 DIAGNOSIS — A6000 Herpesviral infection of urogenital system, unspecified: Secondary | ICD-10-CM | POA: Diagnosis present

## 2019-03-03 DIAGNOSIS — O9902 Anemia complicating childbirth: Secondary | ICD-10-CM | POA: Diagnosis present

## 2019-03-03 DIAGNOSIS — Z3689 Encounter for other specified antenatal screening: Secondary | ICD-10-CM | POA: Diagnosis not present

## 2019-03-03 DIAGNOSIS — Z3400 Encounter for supervision of normal first pregnancy, unspecified trimester: Secondary | ICD-10-CM

## 2019-03-03 DIAGNOSIS — O26893 Other specified pregnancy related conditions, third trimester: Secondary | ICD-10-CM | POA: Diagnosis present

## 2019-03-03 DIAGNOSIS — B009 Herpesviral infection, unspecified: Secondary | ICD-10-CM | POA: Diagnosis present

## 2019-03-03 LAB — CBC
HCT: 33 % — ABNORMAL LOW (ref 36.0–46.0)
Hemoglobin: 11.5 g/dL — ABNORMAL LOW (ref 12.0–15.0)
MCH: 33.4 pg (ref 26.0–34.0)
MCHC: 34.8 g/dL (ref 30.0–36.0)
MCV: 95.9 fL (ref 80.0–100.0)
Platelets: 217 10*3/uL (ref 150–400)
RBC: 3.44 MIL/uL — ABNORMAL LOW (ref 3.87–5.11)
RDW: 13.1 % (ref 11.5–15.5)
WBC: 10.4 10*3/uL (ref 4.0–10.5)
nRBC: 0 % (ref 0.0–0.2)

## 2019-03-03 LAB — POCT FERN TEST: POCT Fern Test: POSITIVE

## 2019-03-03 LAB — TYPE AND SCREEN
ABO/RH(D): O POS
Antibody Screen: NEGATIVE

## 2019-03-03 LAB — SARS CORONAVIRUS 2: SARS Coronavirus 2: NOT DETECTED

## 2019-03-03 MED ORDER — OXYTOCIN 40 UNITS IN NORMAL SALINE INFUSION - SIMPLE MED
2.5000 [IU]/h | INTRAVENOUS | Status: DC
  Filled 2019-03-03: qty 1000

## 2019-03-03 MED ORDER — FLEET ENEMA 7-19 GM/118ML RE ENEM: 1.0000 | ENEMA | RECTAL | Status: DC | PRN

## 2019-03-03 MED ORDER — EPHEDRINE 5 MG/ML INJ: 10.0000 mg | INTRAVENOUS | Status: DC | PRN

## 2019-03-03 MED ORDER — FENTANYL-BUPIVACAINE-NACL 0.5-0.125-0.9 MG/250ML-% EP SOLN: 12.0000 mL/h | EPIDURAL | Status: DC | PRN

## 2019-03-03 MED ORDER — LIDOCAINE HCL (PF) 1 % IJ SOLN: 30.0000 mL | INTRAMUSCULAR | Status: DC | PRN

## 2019-03-03 MED ORDER — SOD CITRATE-CITRIC ACID 500-334 MG/5ML PO SOLN: 30.0000 mL | ORAL | Status: DC | PRN

## 2019-03-03 MED ORDER — TERBUTALINE SULFATE 1 MG/ML IJ SOLN: 0.2500 mg | Freq: Once | INTRAMUSCULAR | Status: DC | PRN

## 2019-03-03 MED ORDER — LIDOCAINE HCL (PF) 1 % IJ SOLN
INTRAMUSCULAR | Status: DC | PRN
  Administered 2019-03-03: 4 mL via EPIDURAL
  Administered 2019-03-03: 6 mL via EPIDURAL

## 2019-03-03 MED ORDER — SODIUM CHLORIDE (PF) 0.9 % IJ SOLN
INTRAMUSCULAR | Status: DC | PRN
  Administered 2019-03-03: 14 mL/h via EPIDURAL

## 2019-03-03 MED ORDER — OXYCODONE-ACETAMINOPHEN 5-325 MG PO TABS: 1.0000 | ORAL_TABLET | ORAL | Status: DC | PRN

## 2019-03-03 MED ORDER — LACTATED RINGERS IV SOLN: 500.0000 mL | Freq: Once | INTRAVENOUS | Status: DC

## 2019-03-03 MED ORDER — FENTANYL CITRATE (PF) 100 MCG/2ML IJ SOLN
100.0000 ug | INTRAMUSCULAR | Status: DC | PRN
  Administered 2019-03-03: 100 ug via INTRAVENOUS
  Filled 2019-03-03: qty 2

## 2019-03-03 MED ORDER — PHENYLEPHRINE 40 MCG/ML (10ML) SYRINGE FOR IV PUSH (FOR BLOOD PRESSURE SUPPORT): 80.0000 ug | PREFILLED_SYRINGE | INTRAVENOUS | Status: DC | PRN

## 2019-03-03 MED ORDER — PHENYLEPHRINE 40 MCG/ML (10ML) SYRINGE FOR IV PUSH (FOR BLOOD PRESSURE SUPPORT)
80.0000 ug | PREFILLED_SYRINGE | INTRAVENOUS | Status: DC | PRN
  Filled 2019-03-03: qty 10

## 2019-03-03 MED ORDER — OXYTOCIN 40 UNITS IN NORMAL SALINE INFUSION - SIMPLE MED
1.0000 m[IU]/min | INTRAVENOUS | Status: DC
  Administered 2019-03-03: 22:00:00 2 m[IU]/min via INTRAVENOUS

## 2019-03-03 MED ORDER — OXYTOCIN BOLUS FROM INFUSION
500.0000 mL | Freq: Once | INTRAVENOUS | Status: AC
  Administered 2019-03-04: 09:00:00 500 mL via INTRAVENOUS

## 2019-03-03 MED ORDER — FENTANYL-BUPIVACAINE-NACL 0.5-0.125-0.9 MG/250ML-% EP SOLN
12.0000 mL/h | EPIDURAL | Status: DC | PRN
  Administered 2019-03-04: 12 mL/h via EPIDURAL
  Filled 2019-03-03 (×2): qty 250

## 2019-03-03 MED ORDER — ONDANSETRON HCL 4 MG/2ML IJ SOLN: 4.0000 mg | Freq: Four times a day (QID) | INTRAMUSCULAR | Status: DC | PRN

## 2019-03-03 MED ORDER — LEVONORGESTREL 19.5 MCG/DAY IU IUD
INTRAUTERINE_SYSTEM | Freq: Once | INTRAUTERINE | Status: AC
  Administered 2019-03-04: 1 via INTRAUTERINE
  Filled 2019-03-03: qty 1

## 2019-03-03 MED ORDER — OXYCODONE-ACETAMINOPHEN 5-325 MG PO TABS: 2.0000 | ORAL_TABLET | ORAL | Status: DC | PRN

## 2019-03-03 MED ORDER — LACTATED RINGERS IV SOLN
INTRAVENOUS | Status: DC
  Administered 2019-03-03 – 2019-03-04 (×4): via INTRAVENOUS

## 2019-03-03 MED ORDER — DIPHENHYDRAMINE HCL 50 MG/ML IJ SOLN: 12.5000 mg | INTRAMUSCULAR | Status: DC | PRN

## 2019-03-03 MED ORDER — LACTATED RINGERS IV SOLN: 500.0000 mL | INTRAVENOUS | Status: DC | PRN

## 2019-03-03 MED ORDER — ACETAMINOPHEN 325 MG PO TABS: 650.0000 mg | ORAL_TABLET | ORAL | Status: DC | PRN

## 2019-03-03 NOTE — Anesthesia Procedure Notes (Signed)
Epidural Patient location during procedure: OB Start time: 03/03/2019 7:00 PM End time: 03/03/2019 7:03 PM  Staffing Anesthesiologist: [name], MD Performed: anesthesiologist   Preanesthetic Checklist Completed: patient identified, pre-op evaluation, timeout performed, IV checked, risks and benefits discussed and monitors and equipment checked  Epidural Patient position: sitting Prep: DuraPrep Patient monitoring: continuous pulse ox and blood pressure Approach: midline Location: L2-L3 Injection technique: LOR saline  Needle:  Needle type: Tuohy  Needle gauge: 17 G Needle length: 9 cm Needle insertion depth: 5 cm Catheter size: 19 Gauge Catheter at skin depth: 10 cm Test dose: negative and Other (1% lidocaine)  Assessment Events: blood not aspirated  Additional Notes Patient identified. Risks including, but not limited to, bleeding, infection, nerve damage, paralysis, inadequate analgesia, blood pressure changes, nausea, vomiting, allergic reaction, postpartum back pain, itching, and headache were discussed. Patient expressed understanding and wished to proceed. Sterile prep and drape, including hand hygiene, mask, and sterile gloves were used. The patient was positioned and the spine was prepped. The skin was anesthetized with lidocaine. No paraesthesia or other complication noted. The patient did not experience any signs of intravascular injection such as tinnitus or metallic taste in mouth, nor signs of intrathecal spread such as rapid motor block. Please see nursing notes for vital signs. The patient tolerated the procedure well.   Natalie Wang, MDReason for block:procedure for pain

## 2019-03-03 NOTE — MAU Note (Signed)
Pt reports ctx since last night. Now about q 25min. Denies any vag bleeding or discharge. Good fetal movement felt.

## 2019-03-03 NOTE — H&P (Signed)
Natalie Wang is a 28 y.o. female presenting for contractions. She states that she started having contractions around 1200 last night. She slept off and on, and then since about 0500 she has been awake. Contractions are about every 5 mins. She states that on arrival here she had a large gush of fluid. She denies any VB. She reports normal fetal movement. She has a hx of HSV and has been on PPX since 36 weeks. She denies any sx of an outbreak at this time.  OB History    Gravida  2   Para      Term      Preterm      AB  1   Living        SAB  1   TAB      Ectopic      Multiple      Live Births             Past Medical History:  Diagnosis Date  . Anxiety   . Asthma   . Herpes    Past Surgical History:  Procedure Laterality Date  . APPENDECTOMY     Family History: family history is not on file. Social History:  reports that she quit smoking about a year ago. She has never used smokeless tobacco. She reports previous alcohol use. She reports previous drug use.   Nursing Staff Provider  Office Location  [facility] Dating   LMP c/w 8wk US  Language  english Anatomy US  normal  Flu Vaccine  n/a Genetic Screen  NIPS:   AFP: negative    TDaP vaccine   12-08-18 Hgb A1C or  GTT Early  Third trimester: normal 2hr GTT  Rhogam  n/a   LAB RESULTS   Feeding Plan Breast Blood Type O/Positive/-- (11/14 1044)   Contraception PP Liletta Antibody Negative (11/14 1044)  Circumcision Undecided  Rubella 2.43 (11/14 1044)  Pediatrician   RPR Non Reactive (03/26 0915)   Support Person Husband - Dean  HBsAg Negative (11/14 1044)   Prenatal Classes  HIV Non Reactive (03/26 0915)  BTL Consent  GBS  GBS negative  VBAC Consent  Pap 07/28/2018 WNL     Hgb Electro      CF     SMA     Waterbirth  [ ]  Class [ ]  Consent [ ]  CNM visit       Maternal Diabetes: No Genetic Screening: Normal Maternal Ultrasounds/Referrals: Normal Fetal Ultrasounds or other Referrals:   None Maternal Substance Abuse:  No  Significant Maternal Medications:  None Significant Maternal Lab Results:  None Other Comments:  HSV on ppx.   Review of Systems  All other systems reviewed and are negative.  Maternal Medical History:  Reason for admission: Rupture of membranes and contractions.   Contractions: Onset was 13-24 hours ago.   Frequency: regular.   Perceived severity is strong.    Fetal activity: Perceived fetal activity is normal.   Last perceived fetal movement was within the past hour.    Prenatal Complications - Diabetes: none.    Dilation: 3.5 Effacement (%): 80 Station: -1 Exam by:: K.Wilson,RN Blood pressure 132/76, pulse 75, temperature 98.3 F (36.8 C), resp. rate 18, last menstrual period 05/31/2018, unknown if currently breastfeeding.   Fetal Exam Fetal Monitor Review: Mode: ultrasound.   Baseline rate: 130.  Variability: moderate (6-25 bpm).   Pattern: accelerations present and no decelerations.    Fetal State Assessment: Category I - tracings are  normal.     Physical Exam  Nursing note and vitals reviewed. Constitutional: She is oriented to person, place, and time. She appears well-developed. No distress.  HENT:  Head: Normocephalic.  Cardiovascular: Normal rate.  Respiratory: Effort normal.  GI: Soft. There is no abdominal tenderness. There is no rebound.  Neurological: She is alert and oriented to person, place, and time.  Skin: Skin is warm and dry.  Psychiatric: She has a normal mood and affect.    Prenatal labs: ABO, Rh: O/Positive/-- (11/14 1044) Antibody: Negative (11/14 1044) Rubella: 2.43 (11/14 1044) RPR: Non Reactive (03/26 0915)  HBsAg: Negative (11/14 1044)  HIV: Non Reactive (03/26 0915)  GBS:   Negative   Assessment/Plan: 28 y.o. G2P0010 at [redacted]w[redacted]d  SROM Admit to labor and delivery  Expectant management Anticipate NSVD GBS neg HSV, on prophylaxis Desire natural childbirth, not planning an epidural Plans  PP IUD    Marcille Buffy DNP, CNM  03/03/19  11:25 AM

## 2019-03-03 NOTE — Anesthesia Preprocedure Evaluation (Signed)
Anesthesia Evaluation  Patient identified by MRN, date of birth, ID band Patient awake    Reviewed: Allergy & Precautions, NPO status , Patient's Chart, lab work & pertinent test results  History of Anesthesia Complications Negative for: history of anesthetic complications  Airway Mallampati: II  TM Distance: >3 FB Neck ROM: Full    Dental   Pulmonary asthma , former smoker,    Pulmonary exam normal        Cardiovascular negative cardio ROS Normal cardiovascular exam     Neuro/Psych PSYCHIATRIC DISORDERS Anxiety negative neurological ROS     GI/Hepatic negative GI ROS, Neg liver ROS,   Endo/Other  negative endocrine ROS  Renal/GU negative Renal ROS     Musculoskeletal negative musculoskeletal ROS (+)   Abdominal   Peds  Hematology  (+) anemia ,   Anesthesia Other Findings HSV-2   Reproductive/Obstetrics                             Anesthesia Physical Anesthesia Plan  ASA: II  Anesthesia Plan: Epidural   Post-op Pain Management:    Induction:   PONV Risk Score and Plan: 2 and Treatment may vary due to age or medical condition  Airway Management Planned: Natural Airway  Additional Equipment: None  Intra-op Plan:   Post-operative Plan:   Informed Consent: I have reviewed the patients History and Physical, chart, labs and discussed the procedure including the risks, benefits and alternatives for the proposed anesthesia with the patient or authorized representative who has indicated his/her understanding and acceptance.       Plan Discussed with: Anesthesiologist  Anesthesia Plan Comments: (Labs reviewed. Platelets acceptable, patient not taking any blood thinning medications. Per RN, FHR tracing reported to be stable enough for sitting procedure. Risks and benefits discussed with patient, including PDPH, backache, epidural hematoma, failed epidural, allergic reaction, and  nerve injury. Patient expressed understanding and wished to proceed.)        Anesthesia Quick Evaluation

## 2019-03-03 NOTE — Progress Notes (Signed)
[facility]N Faculty [facility]: Labor Progress Note  Subjective: Doing well, comfortable now with epidural. Support person, Natalie Wang, at bedside.   Objective: BP (!) 120/57   Pulse 70   Temp (!) 97.5 F (36.4 C) (Axillary)   Resp 16   Ht 5\' 6"  (1.676 m)   Wt 90.7 kg   LMP 05/31/2018 (Exact Date)   SpO2 98%   BMI 32.28 kg/m  Gen: tired-appearing, NAD Dilation: 5 Effacement (%): 90 Station: -1 Presentation: Vertex Exam by:: Dr.Wallace  Assessment and Plan: 28 y.o. G2P0010 [redacted]w[redacted]d here for SOL/SROM around 1100.  Labor: No interval change since last check. Forebag felt - positive fern test on admission after large gush of fluid on arrival. Discussed option of AROM of forebag versus starting pitocin for more regular contraction pattern and patient preference to start with pitocin.  -- pain control: epidural in place -- PPH Risk: low  Fetal Well-Being: Cephalic by sutures.  -- Category I - continuous fetal monitoring  -- GBS negative    Laurel S. Juleen China, DO OB/GYN Fellow, Faculty Practice  9:54 PM

## 2019-03-03 NOTE — MAU Note (Signed)
covid swab collected

## 2019-03-03 NOTE — Progress Notes (Cosign Needed)
OB/[facility]N Faculty Practice: Labor Progress Note *Note late to post due to other patient responsibilities*   Subjective:  Patient doing well.  She reports that she would like to have as natural of the liver as possible.  She is experiencing pain with contractions that are occurring about every 7 minutes.  Objective:  BP (!) 141/78   Pulse 71   Temp 97.8 F (36.6 C) (Oral)   Resp 18   Ht 5\' 6"  (1.676 m)   Wt 90.7 kg   LMP 05/31/2018 (Exact Date)   BMI 32.28 kg/m  Gen: Lying in bed comfortably.  Mild distress with contractions Extremities: Moving spontaneously.  No erythema or swelling CE: Dilation: 4 Effacement (%): 100 Station: -1 Presentation: Vertex Exam by:: Foley,rn FH:  BL 130, moderate var, + a, -d.   Assessment and Plan:  Natalie Wang is a 28 y.o. G2P0010 at [redacted]w[redacted]d - SOL.   Labor: Progressing normally.  Expectant management . Pain control: Labor support without medications . Anticipated MOD: NSVD . PPH Risk: Low  Fetal Wellbeing: Cat I tracing . GBS Negative (05/28 0000)  . Continuous fetal monitoring  Birth control: Plans for post placental IUD Zettie Cooley, M.D.  Family Medicine  PGY-1 03/03/2019 4:40 PM

## 2019-03-03 NOTE — Progress Notes (Signed)
   Natalie Wang is a 28 y.o. G2P0010 at [redacted]w[redacted]d  admitted for SOL.   Subjective:   Objective: Vitals:   03/03/19 1359 03/03/19 1502 03/03/19 1700 03/03/19 1705  BP: 132/74 (!) 141/78  122/60  Pulse: 74 71  67  Resp: 18 18 20 18   Temp:    98.3 F (36.8 C)  TempSrc:    Oral  Weight:      Height:       No intake/output data recorded.  FHT:  FHR: 120 bpm, variability: moderate,  accelerations:  Present,  decelerations:  Absent UC:   irregular, every 2-3 minutes SVE:   Dilation: 5 Effacement (%): 100 Station: -1 Exam by:: Philis Pique, RN  Labs: Lab Results  Component Value Date   WBC 10.4 03/03/2019   HGB 11.5 (L) 03/03/2019   HCT 33.0 (L) 03/03/2019   MCV 95.9 03/03/2019   PLT 217 03/03/2019    Assessment / Plan: active labor  Labor: Progressing normally Fetal Wellbeing:  Category I Pain Control:  Labor support without medications and IV pain meds Anticipated MOD:  NSVD  Mervyn Skeeters [place] 03/03/2019, 5:19 PM

## 2019-03-04 ENCOUNTER — Encounter (HOSPITAL_COMMUNITY): Payer: Self-pay

## 2019-03-04 DIAGNOSIS — Z975 Presence of (intrauterine) contraceptive device: Secondary | ICD-10-CM

## 2019-03-04 DIAGNOSIS — Z3A39 39 weeks gestation of pregnancy: Secondary | ICD-10-CM

## 2019-03-04 DIAGNOSIS — Z30430 Encounter for insertion of intrauterine contraceptive device: Secondary | ICD-10-CM

## 2019-03-04 DIAGNOSIS — O165 Unspecified maternal hypertension, complicating the puerperium: Secondary | ICD-10-CM

## 2019-03-04 LAB — COMPREHENSIVE METABOLIC PANEL
ALT: 14 U/L (ref 0–44)
AST: 26 U/L (ref 15–41)
Albumin: 2.4 g/dL — ABNORMAL LOW (ref 3.5–5.0)
Alkaline Phosphatase: 112 U/L (ref 38–126)
Anion gap: 11 (ref 5–15)
BUN: 7 mg/dL (ref 6–20)
CO2: 20 mmol/L — ABNORMAL LOW (ref 22–32)
Calcium: 8.7 mg/dL — ABNORMAL LOW (ref 8.9–10.3)
Chloride: 104 mmol/L (ref 98–111)
Creatinine, Ser: 0.87 mg/dL (ref 0.44–1.00)
GFR calc Af Amer: >60 mL/min (ref >60)
GFR calc non Af Amer: >60 mL/min (ref >60)
Glucose, Bld: 129 mg/dL — ABNORMAL HIGH (ref 70–99)
Potassium: 3.8 mmol/L (ref 3.5–5.1)
Sodium: 135 mmol/L (ref 135–145)
Total Bilirubin: 0.5 mg/dL (ref 0.3–1.2)
Total Protein: 5.1 g/dL — ABNORMAL LOW (ref 6.5–8.1)

## 2019-03-04 LAB — CBC
HCT: 31.7 % — ABNORMAL LOW (ref 36.0–46.0)
Hemoglobin: 11.2 g/dL — ABNORMAL LOW (ref 12.0–15.0)
MCH: 34 pg (ref 26.0–34.0)
MCHC: 35.3 g/dL (ref 30.0–36.0)
MCV: 96.4 fL (ref 80.0–100.0)
Platelets: 197 10*3/uL (ref 150–400)
RBC: 3.29 MIL/uL — ABNORMAL LOW (ref 3.87–5.11)
RDW: 13.2 % (ref 11.5–15.5)
WBC: 17.1 10*3/uL — ABNORMAL HIGH (ref 4.0–10.5)
nRBC: 0 % (ref 0.0–0.2)

## 2019-03-04 LAB — RPR: RPR Ser Ql: NONREACTIVE

## 2019-03-04 LAB — ABO/RH: ABO/RH(D): O POS

## 2019-03-04 LAB — PROTEIN / CREATININE RATIO, URINE
Creatinine, Urine: 31.32 mg/dL
Protein Creatinine Ratio: 0.19 mg/mg{Cre} — ABNORMAL HIGH (ref 0.00–0.15)
Total Protein, Urine: 6 mg/dL

## 2019-03-04 MED ORDER — COCONUT OIL OIL: 1.0000 "application " | TOPICAL_OIL | Status: DC | PRN

## 2019-03-04 MED ORDER — WITCH HAZEL-GLYCERIN EX PADS: 1.0000 "application " | MEDICATED_PAD | CUTANEOUS | Status: DC | PRN

## 2019-03-04 MED ORDER — PRENATAL MULTIVITAMIN CH
1.0000 | ORAL_TABLET | Freq: Every day | ORAL | Status: DC
  Administered 2019-03-04 – 2019-03-06 (×3): 1 via ORAL
  Filled 2019-03-04 (×3): qty 1

## 2019-03-04 MED ORDER — DIBUCAINE (PERIANAL) 1 % EX OINT: 1.0000 "application " | TOPICAL_OINTMENT | CUTANEOUS | Status: DC | PRN

## 2019-03-04 MED ORDER — ZOLPIDEM TARTRATE 5 MG PO TABS: 5.0000 mg | ORAL_TABLET | Freq: Every evening | ORAL | Status: DC | PRN

## 2019-03-04 MED ORDER — OXYCODONE HCL 5 MG PO TABS
5.0000 mg | ORAL_TABLET | ORAL | Status: DC | PRN
  Administered 2019-03-06: 5 mg via ORAL
  Filled 2019-03-04: qty 1

## 2019-03-04 MED ORDER — ONDANSETRON HCL 4 MG/2ML IJ SOLN: 4.0000 mg | INTRAMUSCULAR | Status: DC | PRN

## 2019-03-04 MED ORDER — TETANUS-DIPHTH-ACELL PERTUSSIS 5-2.5-18.5 LF-MCG/0.5 IM SUSP: 0.5000 mL | Freq: Once | INTRAMUSCULAR | Status: DC

## 2019-03-04 MED ORDER — ACETAMINOPHEN 325 MG PO TABS
650.0000 mg | ORAL_TABLET | ORAL | Status: DC | PRN
  Administered 2019-03-05 – 2019-03-06 (×4): 650 mg via ORAL
  Filled 2019-03-04 (×3): qty 2

## 2019-03-04 MED ORDER — SENNOSIDES-DOCUSATE SODIUM 8.6-50 MG PO TABS
2.0000 | ORAL_TABLET | ORAL | Status: DC
  Administered 2019-03-05 – 2019-03-06 (×2): 2 via ORAL
  Filled 2019-03-04 (×2): qty 2

## 2019-03-04 MED ORDER — SIMETHICONE 80 MG PO CHEW: 80.0000 mg | CHEWABLE_TABLET | ORAL | Status: DC | PRN

## 2019-03-04 MED ORDER — OXYCODONE HCL 5 MG PO TABS: 10.0000 mg | ORAL_TABLET | ORAL | Status: DC | PRN

## 2019-03-04 MED ORDER — BENZOCAINE-MENTHOL 20-0.5 % EX AERO
1.0000 "application " | INHALATION_SPRAY | CUTANEOUS | Status: DC | PRN
  Administered 2019-03-04: 1 via TOPICAL
  Filled 2019-03-04: qty 56

## 2019-03-04 MED ORDER — DIPHENHYDRAMINE HCL 25 MG PO CAPS: 25.0000 mg | ORAL_CAPSULE | Freq: Four times a day (QID) | ORAL | Status: DC | PRN

## 2019-03-04 MED ORDER — ONDANSETRON HCL 4 MG PO TABS: 4.0000 mg | ORAL_TABLET | ORAL | Status: DC | PRN

## 2019-03-04 MED ORDER — IBUPROFEN 600 MG PO TABS
600.0000 mg | ORAL_TABLET | Freq: Four times a day (QID) | ORAL | Status: DC
  Administered 2019-03-04 – 2019-03-06 (×9): 600 mg via ORAL
  Filled 2019-03-04 (×9): qty 1

## 2019-03-04 NOTE — Progress Notes (Signed)
[facility]N Faculty [facility]: Labor Progress Note  Subjective: Doing well, has been able to get some sleep. Plan of care also discussed with RN prior to seeing patient - having occasional late/variable decelerations.   Objective: BP 123/66   Pulse 76   Temp 98.2 F (36.8 C) (Oral)   Resp 16   Ht 5\' 6"  (1.676 m)   Wt 90.7 kg   LMP 05/31/2018 (Exact Date)   SpO2 98%   BMI 32.28 kg/m  Gen: tired-appearing, NAD Dilation: 8 Effacement (%): 90 Cervical Position: Middle Station: -1 Presentation: Vertex Exam by:: [place] RN  Assessment and Plan: 28 y.o. G2P0010 [redacted]w[redacted]d here for SOL/SROM around 1100.  Labor: Progressing. AROM forebag meconium-stained fluid. Continue pitocin. Anticipate SVD. -- pain control: epidural in place -- PPH Risk: low  Fetal Well-Being: EFW 8lbs by Leopolds. Cephalic by sutures.  -- Category II - continuous fetal monitoring - improves with repositioning, received bolus, now more of Category I strip -- GBS negative    Laurel S. Juleen China, DO OB/GYN Fellow, Faculty Practice  6:47 AM

## 2019-03-04 NOTE — Discharge Summary (Addendum)
Postpartum Discharge Summary     Patient Name: Natalie Wang DOB: 06/29/1991 MRN: [id number]  Date of admission: 03/03/2019 Delivering Provider: Fatima Blank A   Date of discharge: 03/06/2019  Admitting diagnosis: PREG Intrauterine pregnancy: [redacted]w[redacted]d     Secondary diagnosis:  Principal Problem:   Normal labor Active Problems:   HSV-2 infection   NSVD (normal spontaneous vaginal delivery)   IUD (intrauterine device) in place   Obstetric labial laceration, delivered, current hospitalization   Hypertension, postpartum condition or complication  Additional problems: None     Discharge diagnosis: Term Pregnancy Delivered                                                                                                Post partum procedures:Post-placental IUD placed   Augmentation: AROM and Pitocin  Complications: None  Hospital course:  Onset of Labor With Vaginal Delivery     28 y.o. yo G2P1011 at [redacted]w[redacted]d was admitted in Active Labor on 03/03/2019. Patient had an uncomplicated labor course as follows:  Membrane Rupture Time/Date: 10:30 AM ,03/03/2019  Intrapartum Procedures: Episiotomy: None [1] , lacerations:  2nd degree [3];Labial [10] . Patient had a delivery of a Viable infant on 03/04/2019. Information for the patient's newborn:  Christene, Pounds [[id number]]  Delivery Method: Vag-Spont    Pateint had an uncomplicated postpartum course.  She is ambulating, tolerating a regular diet, passing flatus, and urinating well. Patient is discharged home in stable condition on 03/06/19.  Magnesium Sulfate recieved: No BMZ received: No  Physical exam  Vitals:   03/05/19 0500 03/05/19 1446 03/05/19 2210 03/06/19 0624  BP: 107/64 121/70 133/66 117/71  Pulse: 72 68 67 (!) 52  Resp: 18 18  18   Temp: 98.8 F (37.1 C) 98.6 F (37 C) 97.8 F (36.6 C) 98.1 F (36.7 C)  TempSrc: Oral Oral Oral   SpO2: 98%   99%  Weight:      Height:       General: alert,  cooperative and no distress Lochia: appropriate Uterine Fundus: firm Incision: N/A DVT Evaluation: No evidence of DVT seen on physical exam. No cords or calf tenderness. 1+ BLEE Labs: Lab Results  Component Value Date   WBC 17.1 (H) 03/04/2019   HGB 11.2 (L) 03/04/2019   HCT 31.7 (L) 03/04/2019   MCV 96.4 03/04/2019   PLT 197 03/04/2019   CMP Latest Ref Rng & Units 03/04/2019  Glucose 70 - 99 mg/dL 129(H)  BUN 6 - 20 mg/dL 7  Creatinine 0.44 - 1.00 mg/dL 0.87  Sodium 135 - 145 mmol/L 135  Potassium 3.5 - 5.1 mmol/L 3.8  Chloride 98 - 111 mmol/L 104  CO2 22 - 32 mmol/L 20(L)  Calcium 8.9 - 10.3 mg/dL 8.7(L)  Total Protein 6.5 - 8.1 g/dL 5.1(L)  Total Bilirubin 0.3 - 1.2 mg/dL 0.5  Alkaline Phos 38 - 126 U/L 112  AST 15 - 41 U/L 26  ALT 0 - 44 U/L 14    Discharge instruction: per After Visit Summary and "Baby and Me Booklet".  After visit meds:  Allergies as of 03/06/2019  No Known Allergies     Medication List    STOP taking these medications   oxyCODONE-acetaminophen 5-325 MG tablet Commonly known as: Percocet     TAKE these medications   acetaminophen 325 MG tablet Commonly known as: Tylenol Take 2 tablets (650 mg total) by mouth every 4 (four) hours as needed (for pain scale < 4).   albuterol 108 (90 Base) MCG/ACT inhaler Commonly known as: VENTOLIN HFA Inhale 1-2 puffs into the lungs every 6 (six) hours as needed for wheezing or shortness of breath.   Doxylamine-Pyridoxine 10-10 MG Tbec Commonly known as: Diclegis Take 2 tablets by mouth at bedtime. If symptoms persist, add one tablet in the morning and one in the afternoon   Fish Oil 500 MG Caps Take 300 mg by mouth.   ibuprofen 600 MG tablet Commonly known as: ADVIL Take 1 tablet (600 mg total) by mouth every 6 (six) hours.   Magnesium 300 MG Caps Take by mouth.   prenatal multivitamin Tabs tablet Take 1 tablet by mouth daily at 12 noon.   valACYclovir 500 MG tablet Commonly known as:  VALTREX Take 1 tablet (500 mg total) by mouth 2 (two) times a day.       Diet: routine diet  Activity: Advance as tolerated. Pelvic rest for 6 weeks.   Outpatient follow up:6 weeks Follow up Appt: No future appointments. Follow up Visit:      Please schedule this patient for PP visit in: 4 weeks  Low risk pregnancy complicated by: n/a  Delivery mode: SVD  Anticipated Birth Control: IUD placed in hospital  PP Procedures needed: IUD string check  Schedule Integrated BH visit: no  Provider: Any provider      Newborn Data: Live born female  Birth Weight: 7 lb 10.2 oz (3465 g) APGAR: 8, 9  Newborn Delivery   Birth date/time: 03/04/2019 09:17:00 Delivery type: Vaginal, Spontaneous      Baby Feeding: Breast Disposition: home   CNM attestation:  I have seen and examined this patient; I agree with above documentation in the resident's note.    PE: BP 117/71   Pulse (!) 52   Temp 98.1 F (36.7 C)   Resp 18   Ht 5\' 6"  (1.676 m)   Wt 200 lb (90.7 kg)   LMP 05/31/2018 (Exact Date)   SpO2 99%   Breastfeeding Unknown   BMI 32.28 kg/m  Gen: calm comfortable, NAD Resp: normal effort, no distress Abd: soft   Plan: - DC home - FU in 6 weeks for IUD string check/trim   Thressa ShellerHeather Maryah Marinaro DNP, CNM  03/06/19  9:59 AM

## 2019-03-04 NOTE — Progress Notes (Signed)
Patient ID: Natalie Wang, female   DOB: 1991-07-18, 28 y.o.   MRN: [id number] Post-Placental IUD Insertion Procedure Note  Patient identified, informed consent signed prior to delivery, signed copy in chart, time out was performed.    Vaginal, labial and perineal areas thoroughly inspected for lacerations. Second degree labial laceration identified - not hemostatic,repaired.  [place] was used for today's insertion.  --IUD inserted ~ 1 hour after delivery and epidural was turned off.  - IUD inserted with inserter per manufacturer's instructions.    Strings trimmed to the level of the introitus. Patient tolerated procedure well.  Lot # [id number] Expiration Date 09/14/2022  Patient given post procedure instructions and IUD care card with expiration date.  Patient is asked to keep IUD strings tucked in her vagina until her postpartum follow up visit in 4-6 weeks. Patient advised to abstain from sexual intercourse and pulling on strings before her follow-up visit. Patient verbalized an understanding of the plan of care and agrees.

## 2019-03-04 NOTE — Progress Notes (Signed)
[facility]N Faculty [facility]: Labor Progress Note  Subjective: Doing well, just had SROM of forebag with turning. Starting to feel a little bit more pressure but not overwhelming.   Objective: BP 115/65   Pulse 79   Temp 98.8 F (37.1 C) (Axillary)   Resp 16   Ht 5\' 6"  (1.676 m)   Wt 90.7 kg   LMP 05/31/2018 (Exact Date)   SpO2 98%   BMI 32.28 kg/m  Gen: well-appearing, NAD Dilation: 5 Effacement (%): 90 Station: -1 Presentation: Vertex Exam by:: Dr.Shakora Nordquist  Assessment and Plan: 28 y.o. G2P0010 [redacted]w[redacted]d here for SOL/SROM around 1100.  Labor: Transitioning into active labor, SROM forebag at 0120 and starting to feel more pressure. Continue pitocin per protocol, anticipate SVD.  -- pain control: epidural in place -- PPH Risk: low  Fetal Well-Being: EFW 8lbs by Leopolds. Cephalic by sutures.  -- Category I - continuous fetal monitoring  -- GBS negative    Sendy Pluta S. Juleen China, DO OB/GYN Fellow, Faculty Practice  2:11 AM

## 2019-03-04 NOTE — Progress Notes (Signed)
Patient stated she went to the bathroom with assistance of FOB without calling for assistance of staff. This nurse had informed patient to call for assistance before getting up the first time and she verbalized understanding.

## 2019-03-04 NOTE — Lactation Note (Signed)
This note was copied from a baby's chart. Lactation Consultation Note  Patient Name: Natalie Wang Date: 03/04/2019 Reason for consult: Initial assessment;Term;Primapara P1.  5 hours old.  Mom was sleeping when I came into room.  Baby has been to breast 5 times since birth.  Instructed to feed with cues and call out for assist prn.  Mom is very sleepy so basic teaching delayed.  Breastfeeding consultation services information given.  Maternal Data    Feeding Feeding Type: Breast Fed  LATCH Score Latch: Repeated attempts needed to sustain latch, nipple held in mouth throughout feeding, stimulation needed to elicit sucking reflex.  Audible Swallowing: A few with stimulation  Type of Nipple: Everted at rest and after stimulation  Comfort (Breast/Nipple): Soft / non-tender  Hold (Positioning): Assistance needed to correctly position infant at breast and maintain latch.  LATCH Score: 7  Interventions    Lactation Tools Discussed/Used     Consult Status Consult Status: Follow-up Date: 03/05/19 Follow-up type: In-patient    Ave Filter 03/04/2019, 2:34 PM

## 2019-03-05 NOTE — Lactation Note (Signed)
This note was copied from a baby's chart. Lactation Consultation Note  Patient Name: Natalie Wang [id number] Date: 03/05/2019 Reason for consult: Follow-up assessment;Term;Primapara Baby is 25 hours old and was recently circumcised.  Baby sleeping in crib but mom would like to attempt a feeding.  Explained that he may be sleepy for a while and then possibly cluster feed later.  Mom reports that baby has been latching well to left but not as well on right.  Assisted with positioning baby in football hold on right.  Hand expression done but stopped because mom was uncomfortable.  No colostrum seen but mom has seen some in the past.  Baby very sleepy at breast and not opening mouth.  Mom concerned but reassurance given.  Instructed to watch for cues and call out for assist.  Maternal Data Has patient been taught Hand Expression?: Yes Does the patient have breastfeeding experience prior to this delivery?: No  Feeding Feeding Type: Breast Fed  LATCH Score Latch: Too sleepy or reluctant, no latch achieved, no sucking elicited.  Audible Swallowing: None  Type of Nipple: Everted at rest and after stimulation  Comfort (Breast/Nipple): Soft / non-tender  Hold (Positioning): Assistance needed to correctly position infant at breast and maintain latch.  LATCH Score: 5  Interventions    Lactation Tools Discussed/Used     Consult Status Consult Status: Follow-up Date: 03/06/19 Follow-up type: In-patient    Ave Filter 03/05/2019, 12:15 PM

## 2019-03-05 NOTE — Progress Notes (Signed)
Post Partum Day 1 Subjective: No complaints, up ad lib, voiding, tolerating PO and + flatus Baby is doing well, breastfeeding.  Objective: Blood pressure 107/64, pulse 72, temperature 98.8 F (37.1 C), temperature source Oral, resp. rate 18, height 5\' 6"  (1.676 m), weight 90.7 kg, last menstrual period 05/31/2018, SpO2 98 %, unknown if currently breastfeeding.  Physical Exam:  General: alert and no distress Lochia: appropriate Uterine Fundus: firm, NT DVT Evaluation: No evidence of DVT seen on physical exam.  Negative Homan's sign. No cords or calf tenderness. No significant calf/ankle edema.  Recent Labs    03/03/19 1132 03/04/19 1240  HGB 11.5* 11.2*  HCT 33.0* 31.7*    Assessment/Plan: Plan for discharge tomorrow, Breastfeeding, Circumcision prior to discharge and Contraception PP IUD placed after delivery.  Patient desires circumcision for her female infant.  Circumcision procedure details discussed, risks and benefits of procedure were also discussed.  These include but are not limited to: Benefits of circumcision in men include reduction in the rates of urinary tract infection (UTI), penile cancer, some sexually transmitted infections, penile inflammatory and retractile disorders, as well as easier hygiene.  Risks include bleeding , infection, injury of glans which may lead to penile deformity or urinary tract issues, unsatisfactory cosmetic appearance and other potential complications related to the procedure.  It was emphasized that this is an elective procedure.  Patient wants to proceed with circumcision; written informed consent obtained.  Will do circumcision soon, routine circumcision and post circumcision care ordered for the infant.   LOS: 2 days   Natalie Schneiders, MD 03/05/2019, 9:16 AM

## 2019-03-05 NOTE — Progress Notes (Signed)
CSW received consult for history of anxiety and depression.  CSW met with MOB to offer support and complete assessment.    MOB sitting up in bed holding infant with FOB present at bedside, when CSW entered the room. CSW introduced self and received verbal permission to complete assessment with FOB present. MOB easy to engage and pleasant throughout assessment. MOB very attentive and appropriate with infant. CSW inquired about MOB's mental health history and MOB acknowledged being diagnosed with anxiety and depression when she was 14. MOB denied any recent symptoms and denied being on any medications or receiving counseling. MOB did share that she had a little bit of anxiety during pregnancy but attributed it to her hormones. CSW provided education regarding the baby blues period vs. perinatal mood disorders, discussed treatment and gave resources for mental health follow up if concerns arise.  CSW recommends self-evaluation during the postpartum time period using the New Mom Checklist from Postpartum Progress and encouraged MOB to contact a medical professional if symptoms are noted at any time. MOB denied any current SI or HI and reported having good support from FOB, their parents and their neighbors.  MOB reported having all essential items for infant once discharged and stated infant would be sleeping in a pack 'n' play once home. CSW provided review of Sudden Infant Death Syndrome (SIDS) precautions and safe sleeping habits.    CSW identifies no further need for intervention and no barriers to discharge at this time.  Natalie Wang, LCSWA  [facility] [phone]   

## 2019-03-05 NOTE — Anesthesia Postprocedure Evaluation (Addendum)
Anesthesia Post Note  Patient: Natalie Wang  Procedure(s) Performed: AN AD HOC LABOR EPIDURAL     Patient location during evaluation: Mother Baby Anesthesia Type: Epidural Level of consciousness: awake and alert Pain management: pain level controlled Vital Signs Assessment: post-procedure vital signs reviewed and stable Respiratory status: spontaneous breathing, respiratory function stable and nonlabored ventilation Cardiovascular status: blood pressure returned to baseline Postop Assessment: epidural receding and no apparent nausea or vomiting Anesthetic complications: no Comments:  Patient discharged prior to postop check by anesthesia team. Unable to reach patient via telephone. Per notes, no complications noted from epidural.    Last Vitals:  Vitals:   03/05/19 0013 03/05/19 0500  BP: 123/66 107/64  Pulse: 79 72  Resp: 18 18  Temp: 36.9 C 37.1 C  SpO2: 97% 98%    Last Pain:  Vitals:   03/05/19 0900  TempSrc:   PainSc: 3    Pain Goal:                   [name]

## 2019-03-06 ENCOUNTER — Encounter: Payer: Medicaid Other | Admitting: Family Medicine

## 2019-03-06 MED ORDER — ACETAMINOPHEN 325 MG PO TABS: 650.0000 mg | ORAL_TABLET | ORAL | Status: DC | PRN

## 2019-03-06 MED ORDER — IBUPROFEN 600 MG PO TABS: 600.0000 mg | ORAL_TABLET | Freq: Four times a day (QID) | ORAL | 0 refills | Status: DC

## 2019-03-06 NOTE — Lactation Note (Signed)
This note was copied from a baby's chart. Lactation Consultation Note:  Infant is 97 hours old and is  at 8% weight loss. Infants output is good but mother complaints of mod-severe nipple pain while breastfeeing.  LC arrived in the room and mother was breastfeeding infant in football  Hold. Infant has pursed lips and mother reports that she has a pain scale of #2-3 on her nipple when breastffeeding.   Assist mother with unlattching infant . Infant unwarped in  2 blankets and advised mother  to do STS with feeding . Mother so tender that she is unable to hand express colostrum.  Infant placed in football hold with better pillow support. Lots of teaching her.  Infant latched with pursed lips. Attempt to flange infants top lip causing lots of pain for mother. Infant has a tight upper lip.  Also tried to adjust infants lower jaw for wider gape. Flanges some but rolls back in. Infant finally got some depth and observed a few swallows. Mother was taught to do breast compression and to release infant without pain to her nipples.   Assessed infants oral cavity and observed that infant has a slightly arched palate and a anterior short tight tongue tie. Infant does have limited lateral movement of his tongue.   Attempt to latch infant on the alternate breast. Infant latched and mother describes severe pain. LC was able to flange infant lips slightly.  Infant on for about 15 mins. With a few swallows observed.   Mother was fit with a #24 NS. She described discomfort with fitting.  Infant lying quietly in crib. Mother was given a hand pump and advised to use for 15 mins on each breast.  Mother to page for Latch assist with next feeding. Discussed supplementing with her ebm/ formula. Parents agreeable with feeding plan. Mother to pump for 15-20 min every 2-3 hours.   Discussed treatment and prevention of engorgement.   Mother is aware of available [employer] services and community support. Mother has [facility] brochure  with phone number to call for breastfeeding questions or concerns.    Patient Name: Natalie Wang Date: 03/06/2019 Reason for consult: Follow-up assessment   Maternal Data    Feeding Feeding Type: Breast Fed  LATCH Score Latch: Repeated attempts needed to sustain latch, nipple held in mouth throughout feeding, stimulation needed to elicit sucking reflex.  Audible Swallowing: A few with stimulation  Type of Nipple: Everted at rest and after stimulation  Comfort (Breast/Nipple): Soft / non-tender  Hold (Positioning): Assistance needed to correctly position infant at breast and maintain latch.  LATCH Score: 7  Interventions    Lactation Tools Discussed/Used     Consult Status      Darla Lesches 03/06/2019, 9:02 AM

## 2019-03-06 NOTE — Lactation Note (Signed)
This note was copied from a baby's chart. Lactation Consultation Note:  Staff nurse paged to return to patients room to assist with latch.  Infant placed in football hold, with good pillow support.  Assist mother with asymmetric latch and mother independently latched infant .  I assist with flanging infants lips for wider gape.  Mother relaxed and denied pain with latch. Observed frequents suckling and audible swallows. Father at the bedside and observed infant drinking from the breast.  Observed infant for approx 20 mins. Infant released the breast and no observed pinching. Mothers nipple round. Mother relatched infant on same breast. Mother doing breast compression. Mother still breast feeding when I left the room.  Staff Nurse Massie Maroon reports that she observed mother breastfeeding on alternate breast in cross cradle. Mother reports that she is having less difficulty and pain.  Mother plans to follow up with Peds Specialist. Parents were given names of two different Dentist that take Medicaid.   Mother is aware of all [employer] services and community support.   Patient Name: Natalie Wang Date: 03/06/2019 Reason for consult: Follow-up assessment   Maternal Data    Feeding Feeding Type: Breast Fed  LATCH Score Latch: Grasps breast easily, tongue down, lips flanged, rhythmical sucking.  Audible Swallowing: Spontaneous and intermittent  Type of Nipple: Everted at rest and after stimulation  Comfort (Breast/Nipple): Filling, red/small blisters or bruises, mild/mod discomfort  Hold (Positioning): Assistance needed to correctly position infant at breast and maintain latch.(assist with flanging infants lips for wider gape.)  LATCH Score: 8  Interventions Interventions: Assisted with latch;Skin to skin;Breast compression;Adjust position;Support pillows;Position options;Expressed milk;Comfort gels;Hand pump  Lactation Tools Discussed/Used Tools: Nipple Shields(fitting with  return demo. mother to page for next feeding) Nipple shield size: 24 Pump Review: Setup, frequency, and cleaning;Milk Storage Initiated by:: Jess Barters RN,IBCLC Date initiated:: 03/06/19   Consult Status Consult Status: Complete Date: 03/06/19 Follow-up type: In-patient    Jess Barters [facility] 03/06/2019, 1:56 PM

## 2019-03-24 ENCOUNTER — Telehealth: Payer: Self-pay | Admitting: Lactation Services

## 2019-04-10 ENCOUNTER — Encounter: Payer: Self-pay | Admitting: Family Medicine

## 2019-04-10 ENCOUNTER — Other Ambulatory Visit: Payer: Self-pay

## 2019-04-10 ENCOUNTER — Ambulatory Visit (INDEPENDENT_AMBULATORY_CARE_PROVIDER_SITE_OTHER): Payer: Medicaid Other | Admitting: Family Medicine

## 2019-04-10 DIAGNOSIS — Z1389 Encounter for screening for other disorder: Secondary | ICD-10-CM

## 2019-04-10 DIAGNOSIS — Z30430 Encounter for insertion of intrauterine contraceptive device: Secondary | ICD-10-CM | POA: Diagnosis not present

## 2019-04-10 MED ORDER — LEVONORGESTREL 19.5 MCG/DAY IU IUD
INTRAUTERINE_SYSTEM | Freq: Once | INTRAUTERINE | Status: AC
  Administered 2019-04-10: 1 via INTRAUTERINE

## 2019-04-10 NOTE — Addendum Note (Signed)
Addended by: Phill Myron on: 04/10/2019 10:56 AM   Modules accepted: Orders

## 2019-04-10 NOTE — Progress Notes (Signed)
Subjective:     Natalie Wang is a 28 y.o. female who presents for a postpartum visit. She is 5 weeks postpartum following a spontaneous vaginal delivery. I have fully reviewed the prenatal and intrapartum course. The delivery was at 51 gestational weeks. Outcome: spontaneous vaginal delivery. Anesthesia: epidural. Postpartum course has been normal. Baby's course has been normal. Baby is feeding by breast. Bleeding no bleeding. Bowel function is normal. Bladder function is normal. Patient is not sexually active. Contraception method is IUD. Postpartum depression screening: negative.  Had PP IUD placed. Having some cramping. Thinks she feels it.  The following portions of the patient's history were reviewed and updated as appropriate: allergies, current medications, past family history, past medical history, past social history, past surgical history and problem list.  Review of Systems Pertinent items are noted in HPI.   Objective:  BP 111/78   Pulse 85   Ht 5\' 6"  (1.676 m)   Wt 173 lb 0.6 oz (78.5 kg)   LMP 05/31/2018 (Exact Date)   BMI 27.93 kg/m    LMP 05/31/2018 (Exact Date)   General:  alert, cooperative and no distress  Lungs: clear to auscultation bilaterally  Heart:  regular rate and rhythm, S1, S2 normal, no murmur, click, rub or gallop  Abdomen: soft, non-tender; bowel sounds normal; no masses,  no organomegaly   Vulva:  normal  Vagina: normal vagina, no discharge, exudate, lesion, or erythema. Perineum intact and well healed.  Cervix:  multiparous appearance. IUD inverted and sticking out of cervix with stem still inside cervix.        IUD grasped with ring forceps and removed easily.  IUD Procedure Note Patient identified, informed consent performed, signed copy in chart, time out was performed.  Urine pregnancy test negative.  Speculum placed in the vagina.  Cervix visualized.  Cleaned with Betadine x 2.  Grasped anteriorly with a single tooth tenaculum.   Uterus sounded to 9 cm.  Liletta  IUD placed per manufacturer's recommendations.  Strings trimmed to 3 cm. Tenaculum was removed, good hemostasis noted.  Patient tolerated procedure well.   Patient given post procedure instructions and Liletta care card with expiration date.  Patient is asked to check IUD strings periodically and follow up in 4-6 weeks for IUD check.  Assessment:     Normal postpartum exam. Pap smear not done at today's visit.   Plan:    1. Contraception: IUD 2. Follow up in: 1 month or as needed.

## 2019-05-11 ENCOUNTER — Encounter: Payer: Self-pay | Admitting: Family Medicine

## 2019-05-11 ENCOUNTER — Ambulatory Visit (INDEPENDENT_AMBULATORY_CARE_PROVIDER_SITE_OTHER): Payer: Medicaid Other | Admitting: Family Medicine

## 2019-05-11 ENCOUNTER — Other Ambulatory Visit: Payer: Self-pay

## 2019-05-11 VITALS — BP 101/55 | HR 66 | Ht 66.0 in | Wt 175.1 lb

## 2019-05-11 DIAGNOSIS — Z30431 Encounter for routine checking of intrauterine contraceptive device: Secondary | ICD-10-CM

## 2019-05-11 NOTE — Progress Notes (Signed)
   Subjective:   Patient Name: Natalie Wang, female   DOB: 1991-02-10, 28 y.o.  MRN: [id number]  HPI Patient here for an IUD check.  She had the [place] IUD placed 1 month ago.  She reports spotting after having the IUD - now stopped.   Review of Systems  Constitutional: Negative for fever and chills.  Gastrointestinal: Negative for abdominal pain.  Genitourinary: Negative for vaginal discharge, vaginal pain, pelvic pain and dyspareunia.        Objective:   Physical Exam  Constitutional: She appears well-developed and well-nourished.  HENT:  Head: Normocephalic and atraumatic.  Abdominal: Soft. There is no tenderness. There is no guarding.  Genitourinary: There is no rash, tenderness or lesion on the right labia. There is no rash, tenderness or lesion on the left labia. No erythema or tenderness in the vagina. No foreign body around the vagina. No signs of injury around the vagina. No vaginal discharge found.    Skin: Skin is warm and dry.  Psychiatric: She has a normal mood and affect. Her behavior is normal. Judgment and thought content normal.       Assessment & Plan:  1. IUD check up IUD in place.  Pt to call with any other problems.  Recheck in 1 year.

## 2019-11-08 IMAGING — US US MFM OB COMP +14 WKS
1 series · 14 of 28 positions shown · non-contrast
Comparison: none

[Series 1: us mfm ob comp +14 wks · 95 acquisitions, 14 frames shown]
[im 4/95]
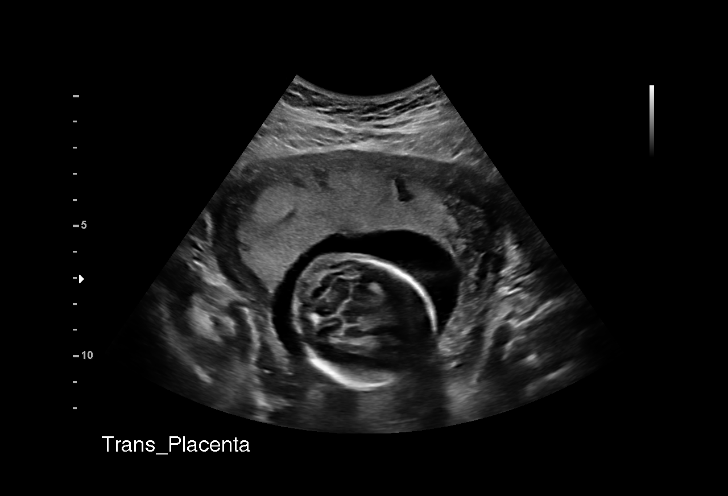
[im 11/95]
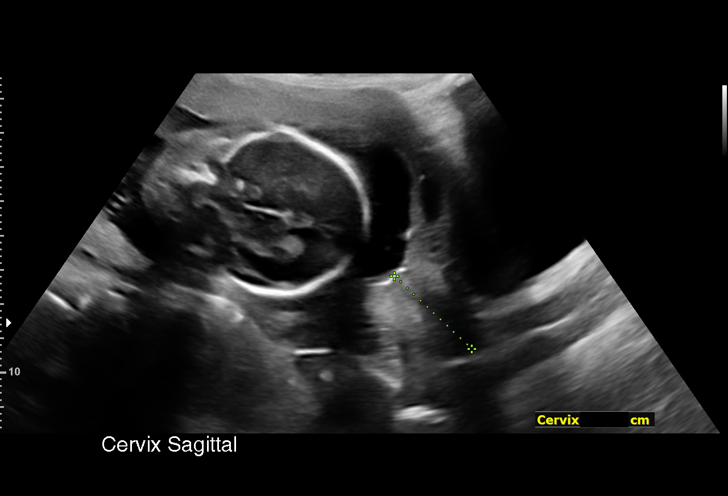
[im 18/95]
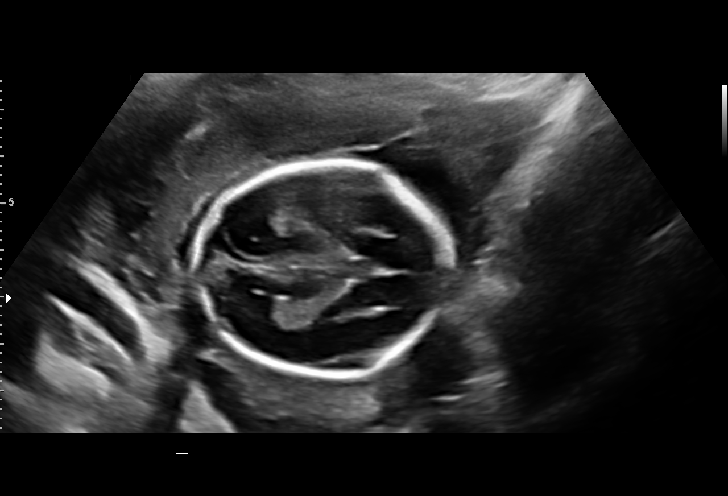
[im 25/95]
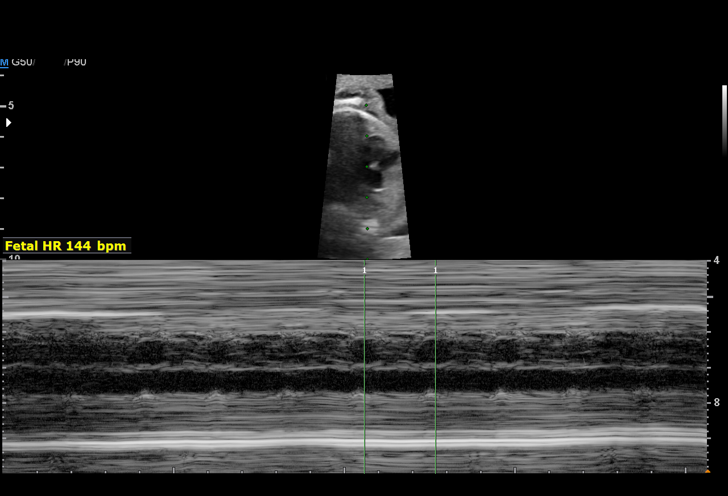
[im 32/95]
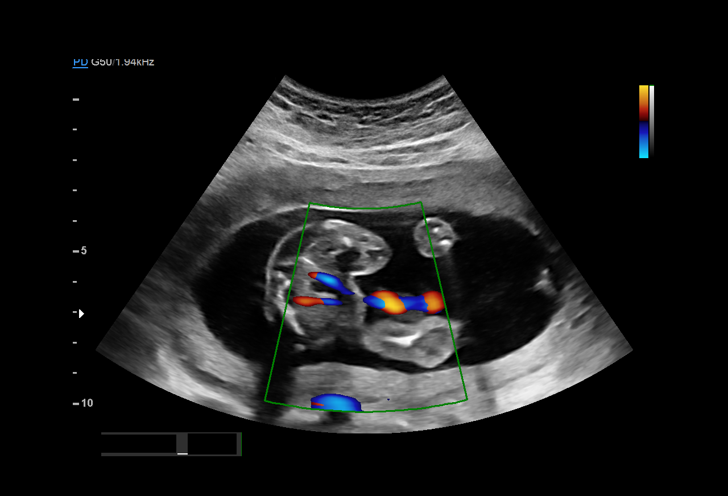
[im 39/95]
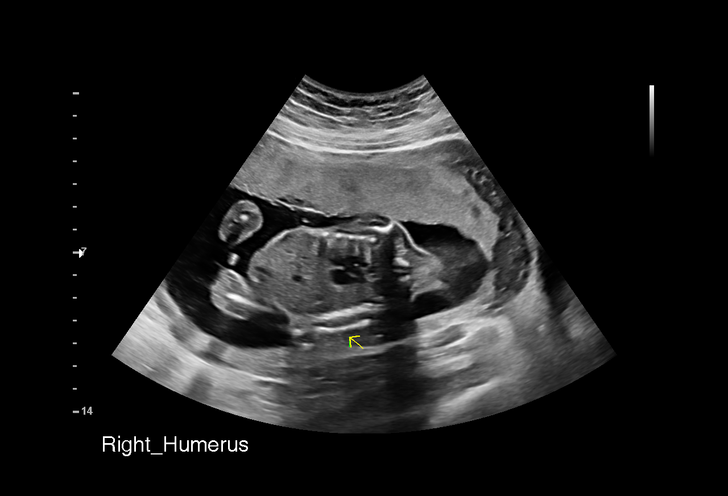
[im 46/95]
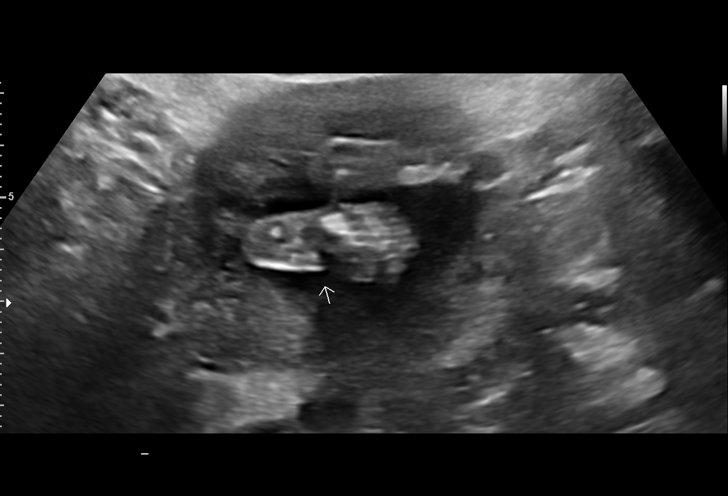
[im 53/95]
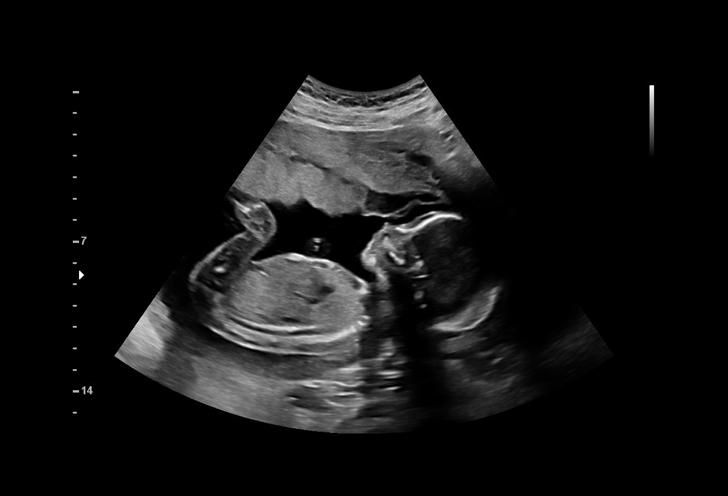
[im 60/95]
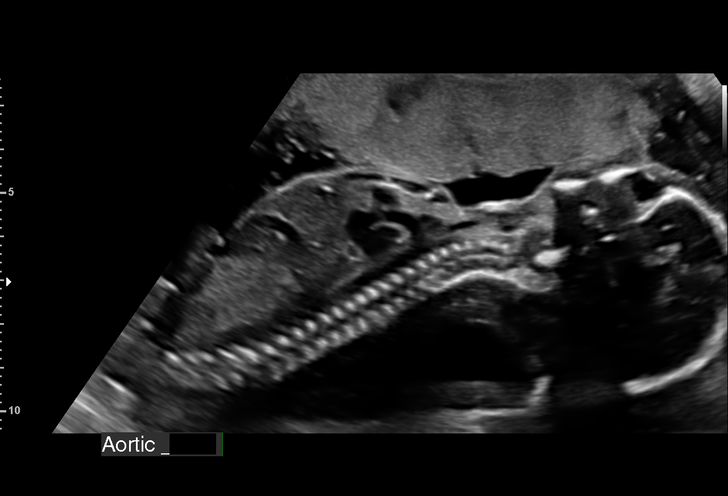
[im 67/95]
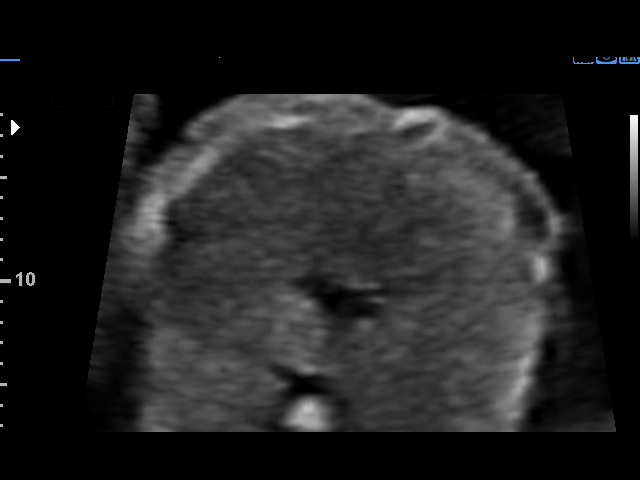
[im 74/95]
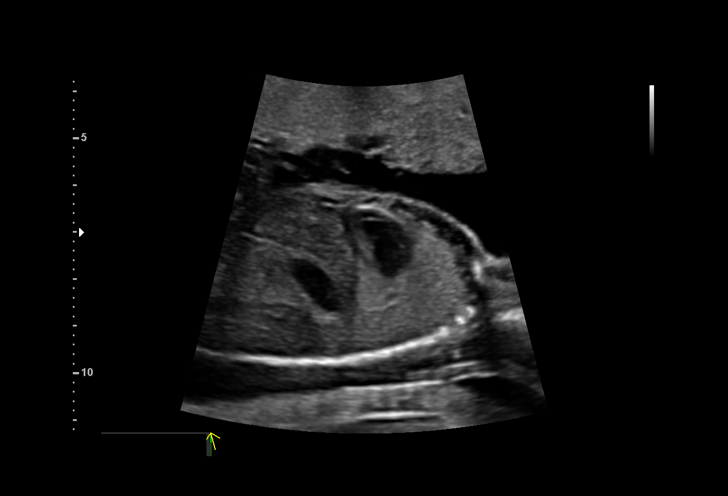
[im 81/95]
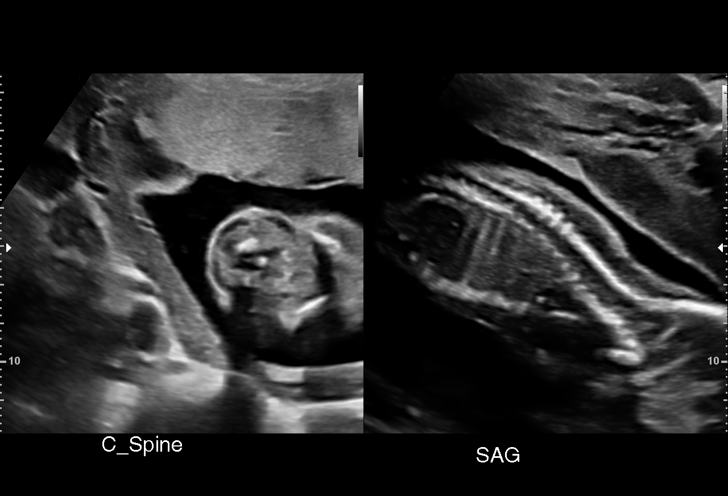
[im 88/95]
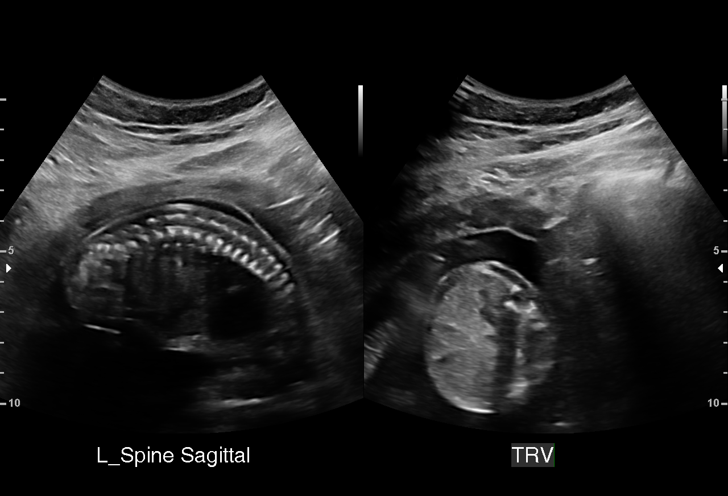
[im 95/95]
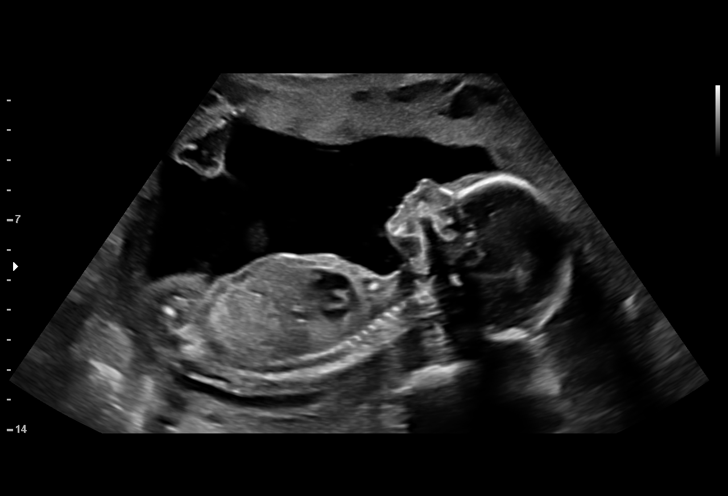

[14 of 28 positions shown; findings below may reference images not displayed]

----------------------------------------------------------------------

 ----------------------------------------------------------------------
Indications

  Encounter for antenatal screening for
  malformations (low risk NIPS)
  19 weeks gestation of pregnancy
  Asthma                                         S55.O5 [id number]
 ----------------------------------------------------------------------
Vital Signs

 BMI:
Fetal Evaluation

 Num Of Fetuses:          1
 Fetal Heart Rate(bpm):   144
 Cardiac Activity:        Observed
 Presentation:            Cephalic
 Placenta:                Anterior
 P. Cord Insertion:       Visualized

 Amniotic Fluid
 AFI FV:      Within normal limits

                             Largest Pocket(cm)

Biometry

 BPD:      45.9  mm     G. Age:  19w 6d         75  %    CI:        77.54   %    70 - 86
                                                         FL/HC:       17.9  %    16.1 -
 HC:       165   mm     G. Age:  19w 1d         40  %    HC/AC:       1.09       1.09 -
 AC:      150.8  mm     G. Age:  20w 2d         78  %    FL/BPD:      64.3  %
 FL:       29.5  mm     G. Age:  19w 1d         36  %    FL/AC:       19.6  %    20 - 24
 HUM:        29  mm     G. Age:  19w 3d         55  %
 CER:      18.8  mm     G. Age:  18w 3d         25  %
 NFT:       3.1  mm

 LV:        6.1  mm
 CM:        3.4  mm

 Est. FW:     308   gm   0 lb 11 oz      53  %
OB History

 Gravidity:    2          SAB:   1
Gestational Age

 LMP:           19w 2d        Date:  05/31/18                 EDD:   03/07/19
 U/S Today:     19w 4d                                        EDD:   03/05/19
 Best:          19w 2d     Det. By:  LMP  (05/31/18)          EDD:   03/07/19
Anatomy

 Cranium:               Appears normal         Aortic Arch:            Appears normal
 Cavum:                 Appears normal         Ductal Arch:            Appears normal
 Ventricles:            Appears normal         Diaphragm:              Appears normal
 Choroid Plexus:        Appears normal         Stomach:                Appears normal, left
                                                                       sided
 Cerebellum:            Appears normal         Abdomen:                Appears normal
 Posterior Fossa:       Appears normal         Abdominal Wall:         Appears nml (cord
                                                                       insert, abd wall)
 Nuchal Fold:           Appears normal         Cord Vessels:           Appears normal (3
                                                                       vessel cord)
 Face:                  Appears normal         Kidneys:                Appear normal
                        (orbits and profile)
 Lips:                  Appears normal         Bladder:                Appears normal
 Thoracic:              Appears normal         Spine:                  Appears normal
 Heart:                 Appears normal         Upper Extremities:      Appears normal
                        (4CH, axis, and
                        situs)
 RVOT:                  Appears normal         Lower Extremities:      Appears normal
 LVOT:                  Appears normal

 Other:  Male gender. Heels visualized. Nasal bone visualized. Open hands
         visualized. Technically difficult due to fetal position and movement.
Cervix Uterus Adnexa

 Cervix
 Length:            3.1  cm.
 Normal appearance by transabdominal scan.

 Uterus
 No abnormality visualized.

 Left Ovary
 Within normal limits.

 Right Ovary
 Within normal limits.
Impression

 Normal interval growth.  No ultrasonic evidence of structural
 fetal anomalies.
Recommendations

 Follow up as clinically indicated.

## 2020-05-23 ENCOUNTER — Ambulatory Visit (INDEPENDENT_AMBULATORY_CARE_PROVIDER_SITE_OTHER): Payer: Medicaid Other | Admitting: Family Medicine

## 2020-05-23 ENCOUNTER — Other Ambulatory Visit: Payer: Self-pay

## 2020-05-23 ENCOUNTER — Other Ambulatory Visit (HOSPITAL_COMMUNITY)
Admission: RE | Admit: 2020-05-23 | Discharge: 2020-05-23 | Disposition: A | Discharge status: Home / Self Care | Payer: Medicaid Other | Source: Ambulatory Visit | Attending: Family Medicine | Admitting: Family Medicine

## 2020-05-23 ENCOUNTER — Encounter: Payer: Self-pay | Admitting: Family Medicine

## 2020-05-23 VITALS — BP 102/59 | HR 78 | Wt 165.0 lb

## 2020-05-23 DIAGNOSIS — Z3A01 Less than 8 weeks gestation of pregnancy: Secondary | ICD-10-CM | POA: Insufficient documentation

## 2020-05-23 DIAGNOSIS — Z3480 Encounter for supervision of other normal pregnancy, unspecified trimester: Secondary | ICD-10-CM | POA: Diagnosis not present

## 2020-05-23 DIAGNOSIS — B009 Herpesviral infection, unspecified: Secondary | ICD-10-CM

## 2020-05-23 NOTE — Progress Notes (Signed)
  Subjective:  Natalie Wang is a [id number] [redacted]w[redacted]d being seen today for her first obstetrical visit.  Her obstetrical history is significant for previous vaginal delivery. Patient does intend to breast feed. Pregnancy history fully reviewed.  Patient reports nausea - mild.  BP (!) 102/59   Pulse 78   Wt 165 lb (74.8 kg)   LMP 03/28/2020   BMI 26.63 kg/m   HISTORY: OB History  Gravida Para Term Preterm AB Living  3 1 1   1 1   SAB TAB Ectopic Multiple Live Births  1     0 1    # Outcome Date GA Lbr Len/2nd Weight Sex Delivery Anes PTL Lv  3 Current           2 Term 03/04/19 [redacted]w[redacted]d 29:29 / 00:48 7 lb 10.2 oz (3.465 kg) M Vag-Spont EPI  LIV  1 SAB 03/2018            Past Medical History:  Diagnosis Date  . Anxiety   . Asthma    inhaler but not regular use  . Herpes     Past Surgical History:  Procedure Laterality Date  . APPENDECTOMY      Family History  Problem Relation Age of Onset  . Cancer Neg Hx   . Hypertension Neg Hx      Exam  BP (!) 102/59   Pulse 78   Wt 165 lb (74.8 kg)   LMP 03/28/2020   BMI 26.63 kg/m   Chaperone present during exam  CONSTITUTIONAL: Well-developed, well-nourished female in no acute distress.  HENT:  Normocephalic, atraumatic, External right and left ear normal. Oropharynx is clear and moist EYES: Conjunctivae and EOM are normal. Pupils are equal, round, and reactive to light. No scleral icterus.  NECK: Normal range of motion, supple, no masses.  Normal thyroid.  CARDIOVASCULAR: Normal heart rate noted, regular rhythm RESPIRATORY: Clear to auscultation bilaterally. Effort and breath sounds normal, no problems with respiration noted. BREASTS: Symmetric in size. No masses, skin changes, nipple drainage, or lymphadenopathy. ABDOMEN: Soft, normal bowel sounds, no distention noted.  No tenderness, rebound or guarding.  PELVIC: Normal appearing external genitalia; normal appearing vaginal mucosa and cervix. No abnormal  discharge noted. Normal uterine size, no other palpable masses, no uterine or adnexal tenderness. MUSCULOSKELETAL: Normal range of motion. No tenderness.  No cyanosis, clubbing, or edema.  2+ distal pulses. SKIN: Skin is warm and dry. No rash noted. Not diaphoretic. No erythema. No pallor. NEUROLOGIC: Alert and oriented to person, place, and time. Normal reflexes, muscle tone coordination. No cranial nerve deficit noted. PSYCHIATRIC: Normal mood and affect. Normal behavior. Normal judgment and thought content.    Assessment:    Pregnancy: [id number] Patient Active Problem List   Diagnosis Date Noted  . Supervision of other normal pregnancy, antepartum 05/23/2020  . IUD (intrauterine device) in place 03/04/2019  . 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 Supervision of other normal pregnancy, antepartum FHT and FH normal - CBC/D/Plt+RPR+Rh+ABO+Rub Ab... - SMN1 COPY NUMBER ANALYSIS (SMA Carrier Screen) - Culture, OB Urine - CHL AMB BABYSCRIPTS SCHEDULE OPTIMIZATION - GC/Chlamydia probe amp ([place])not at [facility]  2. 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 75% of 30 min visit spent on counseling and coordination of care.     [facility] 05/23/2020

## 2020-05-23 NOTE — Progress Notes (Signed)
DATING AND VIABILITY SONOGRAM   Natalie Wang is a 29 y.o. year old G24P1011 with LMP Patient's last menstrual period was 03/28/2020. which would correlate to  [redacted]w[redacted]d weeks gestation.  She has regular menstrual cycles.  She only had one cycle after having her IUD removed.  She is here today for a confirmatory initial sonogram.    GESTATION: SINGLETON    FETAL ACTIVITY:          Heart rate       141          The fetus is active.     GESTATIONAL AGE AND  BIOMETRICS:  Gestational criteria: Estimated Date of Delivery: 01/02/21 by LMP now at [redacted]w[redacted]d  Previous Scans:0  GESTATIONAL SAC           0.49cm         6-1 weeks  CROWN RUMP LENGTH           cm        6-5 weeks                                                                               AVERAGE EGA(BY THIS SCAN):  6-1 weeks  WORKING EDD( early ultrasound ):  6-1     TECHNICIAN COMMENTS: Patient informed that the ultrasound is considered a limited obstetric ultrasound and is not intended to be a complete ultrasound exam. Patient also informed that the ultrasound is not being completed with the intent of assessing for fetal or placental anomalies or any pelvic abnormalities. Explained that the purpose of today's ultrasound is to assess for fetal heart rate. Patient acknowledges the purpose of the exam and the limitations of the study.   Armandina Stammer 05/23/2020 10:26 AM

## 2020-05-23 NOTE — Addendum Note (Signed)
Addended by: Lorelle Gibbs L on: 05/23/2020 10:43 AM   Modules accepted: Orders

## 2020-05-24 LAB — CYTOLOGY - PAP
Chlamydia: NEGATIVE
Comment: NEGATIVE
Comment: NEGATIVE
Comment: NORMAL
Diagnosis: NEGATIVE
High risk HPV: NEGATIVE
Neisseria Gonorrhea: NEGATIVE

## 2020-05-25 LAB — URINE CULTURE, OB REFLEX

## 2020-05-25 LAB — CULTURE, OB URINE

## 2020-06-13 LAB — CBC/D/PLT+RPR+RH+ABO+RUB AB...
Antibody Screen: NEGATIVE
Basophils Absolute: 0.1 10*3/uL (ref 0.0–0.2)
Basos: 1 %
EOS (ABSOLUTE): 0 10*3/uL (ref 0.0–0.4)
Eos: 0 %
HCV Ab: <0.1 s/co ratio (ref 0.0–0.9)
HIV Screen 4th Generation wRfx: NONREACTIVE
Hematocrit: 37.2 % (ref 34.0–46.6)
Hemoglobin: 12.9 g/dL (ref 11.1–15.9)
Hepatitis B Surface Ag: NEGATIVE
Immature Grans (Abs): 0 10*3/uL (ref 0.0–0.1)
Immature Granulocytes: 0 %
Lymphocytes Absolute: 1.7 10*3/uL (ref 0.7–3.1)
Lymphs: 22 %
MCH: 33.3 pg — ABNORMAL HIGH (ref 26.6–33.0)
MCHC: 34.7 g/dL (ref 31.5–35.7)
MCV: 96 fL (ref 79–97)
Monocytes Absolute: 0.5 10*3/uL (ref 0.1–0.9)
Monocytes: 6 %
Neutrophils Absolute: 5.5 10*3/uL (ref 1.4–7.0)
Neutrophils: 71 %
Platelets: 311 10*3/uL (ref 150–450)
RBC: 3.87 x10E6/uL (ref 3.77–5.28)
RDW: 12.1 % (ref 11.7–15.4)
RPR Ser Ql: NONREACTIVE
Rh Factor: POSITIVE
Rubella Antibodies, IGG: 2.24 index (ref >0.99)
WBC: 7.7 10*3/uL (ref 3.4–10.8)

## 2020-06-13 LAB — SMN1 COPY NUMBER ANALYSIS (SMA CARRIER SCREENING)

## 2020-06-13 LAB — HCV INTERPRETATION

## 2020-06-20 ENCOUNTER — Ambulatory Visit (INDEPENDENT_AMBULATORY_CARE_PROVIDER_SITE_OTHER): Payer: Medicaid Other | Admitting: Family Medicine

## 2020-06-20 ENCOUNTER — Other Ambulatory Visit: Payer: Self-pay

## 2020-06-20 VITALS — BP 110/61 | HR 76 | Wt 167.0 lb

## 2020-06-20 DIAGNOSIS — Z3A10 10 weeks gestation of pregnancy: Secondary | ICD-10-CM

## 2020-06-20 DIAGNOSIS — B009 Herpesviral infection, unspecified: Secondary | ICD-10-CM

## 2020-06-20 DIAGNOSIS — Z3480 Encounter for supervision of other normal pregnancy, unspecified trimester: Secondary | ICD-10-CM

## 2020-06-20 NOTE — Progress Notes (Signed)
Patient declined flu shot. Armandina Stammer RN

## 2020-06-20 NOTE — Addendum Note (Signed)
Addended by: Anell Barr on: 06/20/2020 10:41 AM   Modules accepted: Orders

## 2020-06-20 NOTE — Progress Notes (Signed)
° °  PRENATAL VISIT NOTE  Subjective:  Natalie Wang is a 29 y.o. G3P1011 at [redacted]w[redacted]d being seen today for ongoing prenatal care.  She is currently monitored for the following issues for this low-risk pregnancy and has HSV-2 infection; IUD (intrauterine device) in place; and Supervision of other normal pregnancy, antepartum on their problem list.  Patient reports no complaints.  Contractions: Not present. Vag. Bleeding: None.  Movement: Absent. Denies leaking of fluid.   The following portions of the patient's history were reviewed and updated as appropriate: allergies, current medications, past family history, past medical history, past social history, past surgical history and problem list.   Objective:   Vitals:   06/20/20 1000  BP: 110/61  Pulse: 76  Weight: 167 lb (75.8 kg)    Fetal Status: Fetal Heart Rate (bpm): 168   Movement: Absent     General:  Alert, oriented and cooperative. Patient is in no acute distress.  Skin: Skin is warm and dry. No rash noted.   Cardiovascular: Normal heart rate noted  Respiratory: Normal respiratory effort, no problems with respiration noted  Abdomen: Soft, gravid, appropriate for gestational age.  Pain/Pressure: Absent     Pelvic: Cervical exam deferred        Extremities: Normal range of motion.  Edema: None  Mental Status: Normal mood and affect. Normal behavior. Normal judgment and thought content.   Assessment and Plan:  Pregnancy: G3P1011 at [redacted]w[redacted]d 1. Supervision of other normal pregnancy, antepartum FHT normal - [place] MFM OB COMP + 14 WK; Future  2. [redacted] weeks gestation of pregnancy  3. HSV-2 infection ppx at 35 weeks  Preterm labor symptoms and general obstetric precautions including but not limited to vaginal bleeding, contractions, leaking of fluid and fetal movement were reviewed in detail with the patient. Please refer to After Visit Summary for other counseling recommendations.   Return in about 4 weeks (around  07/18/2020).  No future appointments.  Levie Heritage, DO

## 2020-07-19 ENCOUNTER — Ambulatory Visit (INDEPENDENT_AMBULATORY_CARE_PROVIDER_SITE_OTHER): Payer: Medicaid Other | Admitting: Family Medicine

## 2020-07-19 ENCOUNTER — Other Ambulatory Visit: Payer: Self-pay

## 2020-07-19 VITALS — BP 103/48 | HR 68 | Wt 174.0 lb

## 2020-07-19 DIAGNOSIS — B009 Herpesviral infection, unspecified: Secondary | ICD-10-CM

## 2020-07-19 DIAGNOSIS — Z3480 Encounter for supervision of other normal pregnancy, unspecified trimester: Secondary | ICD-10-CM

## 2020-07-19 NOTE — Progress Notes (Signed)
   PRENATAL VISIT NOTE  Subjective:  Natalie Wang is a 29 y.o. G3P1011 at [redacted]w[redacted]d being seen today for ongoing prenatal care.  She is currently monitored for the following issues for this low-risk pregnancy and has HSV-2 infection; IUD (intrauterine device) in place; and Supervision of other normal pregnancy, antepartum on their problem list.  Patient reports no complaints.  Contractions: Not present. Vag. Bleeding: None.  Movement: Absent. Denies leaking of fluid.   The following portions of the patient's history were reviewed and updated as appropriate: allergies, current medications, past family history, past medical history, past social history, past surgical history and problem list.   Objective:   Vitals:   07/19/20 0949  BP: (!) 103/48  Pulse: 68  Weight: 174 lb (78.9 kg)    Fetal Status: Fetal Heart Rate (bpm): 152    Movement: Absent     General:  Alert, oriented and cooperative. Patient is in no acute distress.  Skin: Skin is warm and dry. No rash noted.   Cardiovascular: Normal heart rate noted  Respiratory: Normal respiratory effort, no problems with respiration noted  Abdomen: Soft, gravid, appropriate for gestational age.  Pain/Pressure: Absent     Pelvic: Cervical exam deferred        Extremities: Normal range of motion.  Edema: None  Mental Status: Normal mood and affect. Normal behavior. Normal judgment and thought content.   Assessment and Plan:  Pregnancy: G3P1011 at [redacted]w[redacted]d 1. Supervision of other normal pregnancy, antepartum FHT and FH normal  2. HSV-2 infection PPx at 35 weeks  Preterm labor symptoms and general obstetric precautions including but not limited to vaginal bleeding, contractions, leaking of fluid and fetal movement were reviewed in detail with the patient. Please refer to After Visit Summary for other counseling recommendations.   Return in about 8 weeks (around 09/13/2020) for OB f/u, In Office.  Future Appointments  Date Time  Provider Department Center  08/16/2020  9:00 AM Levie Heritage, DO [facility] None  08/27/2020 10:45 AM [facility] US5 [facility]    Levie Heritage, DO

## 2020-08-16 ENCOUNTER — Encounter: Payer: Medicaid Other | Admitting: Family Medicine

## 2020-08-22 ENCOUNTER — Ambulatory Visit: Payer: Medicaid Other

## 2020-08-27 ENCOUNTER — Ambulatory Visit: Payer: Medicaid Other | Attending: Family Medicine

## 2020-08-27 ENCOUNTER — Other Ambulatory Visit: Payer: Self-pay

## 2020-08-27 ENCOUNTER — Other Ambulatory Visit: Payer: Self-pay | Admitting: *Deleted

## 2020-08-27 DIAGNOSIS — Z3480 Encounter for supervision of other normal pregnancy, unspecified trimester: Secondary | ICD-10-CM | POA: Diagnosis present

## 2020-08-27 DIAGNOSIS — Z362 Encounter for other antenatal screening follow-up: Secondary | ICD-10-CM

## 2020-09-12 ENCOUNTER — Encounter: Payer: Medicaid Other | Admitting: Obstetrics & Gynecology

## 2020-09-14 NOTE — L&D Delivery Note (Signed)
Delivery Note Called to bedside and patient complete and pushing. At 6:55 AM a viable female was delivered via Vaginal, Spontaneous (Presentation: Left Occiput Anterior).  APGAR: 8, 9; weight pending.   Placenta status: Spontaneous, Intact.  Cord: 3 vessels with the following complications: None. Post placental liletta placed upon expulsion of placenta, refer to procedure note for details.  Anesthesia: Epidural Episiotomy: None Lacerations: None Est. Blood Loss (mL): 200  Mom to postpartum.  Baby to Couplet care / Skin to Skin.  Alric Seton 01/05/2021, 7:22 AM

## 2020-09-19 ENCOUNTER — Other Ambulatory Visit: Payer: Self-pay

## 2020-09-19 ENCOUNTER — Ambulatory Visit (INDEPENDENT_AMBULATORY_CARE_PROVIDER_SITE_OTHER): Payer: Medicaid Other | Admitting: Family Medicine

## 2020-09-19 VITALS — BP 95/52 | HR 70

## 2020-09-19 DIAGNOSIS — Z3480 Encounter for supervision of other normal pregnancy, unspecified trimester: Secondary | ICD-10-CM

## 2020-09-19 DIAGNOSIS — Z3A23 23 weeks gestation of pregnancy: Secondary | ICD-10-CM

## 2020-09-19 NOTE — Progress Notes (Signed)
   PRENATAL VISIT NOTE  Subjective:  Natalie Wang is a 30 y.o. G3P1011 at [redacted]w[redacted]d being seen today for ongoing prenatal care.  She is currently monitored for the following issues for this low-risk pregnancy and has HSV-2 infection; IUD (intrauterine device) in place; and Supervision of other normal pregnancy, antepartum on their problem list.  Patient reports no complaints.  Contractions: Not present. Vag. Bleeding: None.  Movement: Present. Denies leaking of fluid.   The following portions of the patient's history were reviewed and updated as appropriate: allergies, current medications, past family history, past medical history, past social history, past surgical history and problem list.   Objective:   Vitals:   09/19/20 0932  BP: (!) 95/52  Pulse: 70    Fetal Status: Fetal Heart Rate (bpm): 150 Fundal Height: 25 cm Movement: Present     General:  Alert, oriented and cooperative. Patient is in no acute distress.  Skin: Skin is warm and dry. No rash noted.   Cardiovascular: Normal heart rate noted  Respiratory: Normal respiratory effort, no problems with respiration noted  Abdomen: Soft, gravid, appropriate for gestational age.  Pain/Pressure: Absent     Pelvic: Cervical exam deferred        Extremities: Normal range of motion.  Edema: None  Mental Status: Normal mood and affect. Normal behavior. Normal judgment and thought content.   Assessment and Plan:  Pregnancy: G3P1011 at [redacted]w[redacted]d 1. Supervision of other normal pregnancy, antepartum FHT normal. Has f/u US scheduled.  2. [redacted] weeks gestation of pregnancy   Preterm labor symptoms and general obstetric precautions including but not limited to vaginal bleeding, contractions, leaking of fluid and fetal movement were reviewed in detail with the patient. Please refer to After Visit Summary for other counseling recommendations.   Return in about 4 weeks (around 10/17/2020) for OB f/u, 2 hr GTT.  Future Appointments   Date Time Provider Department Center  09/25/2020 11:00 AM [facility] NURSE [facility]  09/25/2020 11:15 AM [facility] US2 [facility]  10/17/2020  8:30 AM Levie Heritage, DO [facility] None    Levie Heritage, DO

## 2020-09-25 ENCOUNTER — Ambulatory Visit: Payer: Medicaid Other

## 2020-10-10 ENCOUNTER — Ambulatory Visit: Payer: Medicaid Other | Attending: Obstetrics and Gynecology | Admitting: *Deleted

## 2020-10-10 ENCOUNTER — Other Ambulatory Visit: Payer: Self-pay

## 2020-10-10 ENCOUNTER — Ambulatory Visit (HOSPITAL_BASED_OUTPATIENT_CLINIC_OR_DEPARTMENT_OTHER): Payer: Medicaid Other

## 2020-10-10 ENCOUNTER — Encounter: Payer: Self-pay | Admitting: *Deleted

## 2020-10-10 DIAGNOSIS — Z3480 Encounter for supervision of other normal pregnancy, unspecified trimester: Secondary | ICD-10-CM

## 2020-10-10 DIAGNOSIS — O98512 Other viral diseases complicating pregnancy, second trimester: Secondary | ICD-10-CM | POA: Insufficient documentation

## 2020-10-10 DIAGNOSIS — B009 Herpesviral infection, unspecified: Secondary | ICD-10-CM | POA: Diagnosis not present

## 2020-10-10 DIAGNOSIS — Z362 Encounter for other antenatal screening follow-up: Secondary | ICD-10-CM

## 2020-10-10 DIAGNOSIS — Z3A26 26 weeks gestation of pregnancy: Secondary | ICD-10-CM

## 2020-10-17 ENCOUNTER — Other Ambulatory Visit: Payer: Self-pay

## 2020-10-17 ENCOUNTER — Ambulatory Visit (INDEPENDENT_AMBULATORY_CARE_PROVIDER_SITE_OTHER): Payer: Medicaid Other | Admitting: Family Medicine

## 2020-10-17 VITALS — BP 104/61 | HR 87 | Wt 179.0 lb

## 2020-10-17 DIAGNOSIS — B009 Herpesviral infection, unspecified: Secondary | ICD-10-CM

## 2020-10-17 DIAGNOSIS — Z23 Encounter for immunization: Secondary | ICD-10-CM

## 2020-10-17 DIAGNOSIS — Z3A27 27 weeks gestation of pregnancy: Secondary | ICD-10-CM | POA: Diagnosis not present

## 2020-10-17 DIAGNOSIS — Z3480 Encounter for supervision of other normal pregnancy, unspecified trimester: Secondary | ICD-10-CM

## 2020-10-17 NOTE — Progress Notes (Signed)
   PRENATAL VISIT NOTE  Subjective:  Natalie Wang is a 30 y.o. G3P1011 at [redacted]w[redacted]d being seen today for ongoing prenatal care.  She is currently monitored for the following issues for this low-risk pregnancy and has HSV-2 infection; IUD (intrauterine device) in place; and Supervision of other normal pregnancy, antepartum on their problem list.  Patient reports no complaints.  Contractions: Not present. Vag. Bleeding: None.  Movement: Present. Denies leaking of fluid.   The following portions of the patient's history were reviewed and updated as appropriate: allergies, current medications, past family history, past medical history, past social history, past surgical history and problem list.   Objective:   Vitals:   10/17/20 0828  BP: 104/61  Pulse: 87  Weight: 179 lb (81.2 kg)    Fetal Status: Fetal Heart Rate (bpm): 148   Movement: Present     General:  Alert, oriented and cooperative. Patient is in no acute distress.  Skin: Skin is warm and dry. No rash noted.   Cardiovascular: Normal heart rate noted  Respiratory: Normal respiratory effort, no problems with respiration noted  Abdomen: Soft, gravid, appropriate for gestational age.  Pain/Pressure: Absent     Pelvic: Cervical exam deferred        Extremities: Normal range of motion.  Edema: None  Mental Status: Normal mood and affect. Normal behavior. Normal judgment and thought content.   Assessment and Plan:  Pregnancy: G3P1011 at [redacted]w[redacted]d 1. [redacted] weeks gestation of pregnancy - CBC - RPR - HIV Antibody (routine testing w rflx) - Glucose Tolerance, 2 Hours w/1 Hour - Tdap vaccine greater than or equal to 7yo IM  2. Supervision of other normal pregnancy, antepartum FHT and FH normal  3. HSV-2 infection ppx at 35 weeks  Preterm labor symptoms and general obstetric precautions including but not limited to vaginal bleeding, contractions, leaking of fluid and fetal movement were reviewed in detail with the  patient. Please refer to After Visit Summary for other counseling recommendations.   No follow-ups on file.  No future appointments.  Levie Heritage, DO

## 2020-10-18 LAB — CBC
Hematocrit: 33.8 % — ABNORMAL LOW (ref 34.0–46.6)
Hemoglobin: 11.6 g/dL (ref 11.1–15.9)
MCH: 33.6 pg — ABNORMAL HIGH (ref 26.6–33.0)
MCHC: 34.3 g/dL (ref 31.5–35.7)
MCV: 98 fL — ABNORMAL HIGH (ref 79–97)
Platelets: 307 10*3/uL (ref 150–450)
RBC: 3.45 x10E6/uL — ABNORMAL LOW (ref 3.77–5.28)
RDW: 12.7 % (ref 11.7–15.4)
WBC: 9.7 10*3/uL (ref 3.4–10.8)

## 2020-10-18 LAB — RPR: RPR Ser Ql: NONREACTIVE

## 2020-10-18 LAB — GLUCOSE TOLERANCE, 2 HOURS W/ 1HR
Glucose, 1 hour: 155 mg/dL (ref 65–179)
Glucose, 2 hour: 125 mg/dL (ref 65–152)
Glucose, Fasting: 86 mg/dL (ref 65–91)

## 2020-10-18 LAB — HIV ANTIBODY (ROUTINE TESTING W REFLEX): HIV Screen 4th Generation wRfx: NONREACTIVE

## 2020-11-14 ENCOUNTER — Other Ambulatory Visit: Payer: Self-pay

## 2020-11-14 ENCOUNTER — Ambulatory Visit (INDEPENDENT_AMBULATORY_CARE_PROVIDER_SITE_OTHER): Payer: Medicaid Other | Admitting: Family Medicine

## 2020-11-14 VITALS — BP 98/57 | HR 83 | Wt 181.0 lb

## 2020-11-14 DIAGNOSIS — Z3A31 31 weeks gestation of pregnancy: Secondary | ICD-10-CM

## 2020-11-14 DIAGNOSIS — B009 Herpesviral infection, unspecified: Secondary | ICD-10-CM

## 2020-11-14 DIAGNOSIS — J4520 Mild intermittent asthma, uncomplicated: Secondary | ICD-10-CM

## 2020-11-14 DIAGNOSIS — Z3480 Encounter for supervision of other normal pregnancy, unspecified trimester: Secondary | ICD-10-CM

## 2020-11-14 MED ORDER — BUDESONIDE 90 MCG/ACT IN AEPB: 1.0000 | INHALATION_SPRAY | Freq: Two times a day (BID) | RESPIRATORY_TRACT | 3 refills | Status: DC

## 2020-11-14 MED ORDER — ALBUTEROL SULFATE HFA 108 (90 BASE) MCG/ACT IN AERS: 2.0000 | INHALATION_SPRAY | Freq: Four times a day (QID) | RESPIRATORY_TRACT | 3 refills | Status: DC | PRN

## 2020-11-14 NOTE — Progress Notes (Signed)
   PRENATAL VISIT NOTE  Subjective:  Natalie Wang is a 30 y.o. G3P1011 at [redacted]w[redacted]d being seen today for ongoing prenatal care.  She is currently monitored for the following issues for this low-risk pregnancy and has HSV-2 infection and Supervision of other normal pregnancy, antepartum on their problem list.  Patient reports using albuterol inhaler almost every other day, usually at night..  Contractions: Not present. Vag. Bleeding: None.  Movement: Present. Denies leaking of fluid.   The following portions of the patient's history were reviewed and updated as appropriate: allergies, current medications, past family history, past medical history, past social history, past surgical history and problem list.   Objective:   Vitals:   11/14/20 0838  BP: (!) 98/57  Pulse: 83  Weight: 181 lb (82.1 kg)    Fetal Status: Fetal Heart Rate (bpm): 143 Fundal Height: 31 cm Movement: Present     General:  Alert, oriented and cooperative. Patient is in no acute distress.  Skin: Skin is warm and dry. No rash noted.   Cardiovascular: Normal heart rate noted  Respiratory: Normal respiratory effort, no problems with respiration noted  Abdomen: Soft, gravid, appropriate for gestational age.  Pain/Pressure: Present     Pelvic: Cervical exam deferred        Extremities: Normal range of motion.  Edema: Trace  Mental Status: Normal mood and affect. Normal behavior. Normal judgment and thought content.   Assessment and Plan:  Pregnancy: G3P1011 at [redacted]w[redacted]d 1. [redacted] weeks gestation of pregnancy  2. Supervision of other normal pregnancy, antepartum FHT and FH normal  3. HSV-2 infection Valtrex at 35 weeks  4. Mild intermittent asthma without complication Increase therapy to low dose pulmicort BID  Preterm labor symptoms and general obstetric precautions including but not limited to vaginal bleeding, contractions, leaking of fluid and fetal movement were reviewed in detail with the  patient. Please refer to After Visit Summary for other counseling recommendations.   Return in about 2 weeks (around 11/28/2020) for OB f/u, In Office.  Future Appointments  Date Time Provider Department Center  11/26/2020 11:05 AM Aviva Signs, CNM [facility] None  12/12/2020 11:00 AM Levie Heritage, DO [facility] None  12/19/2020 11:00 AM Levie Heritage, DO [facility] None  12/26/2020 11:00 AM Levie Heritage, DO [facility] None  01/02/2021 11:00 AM Levie Heritage, DO [facility] None  01/09/2021 11:00 AM Levie Heritage, DO [facility] None    Levie Heritage, DO

## 2020-11-26 ENCOUNTER — Encounter: Payer: Self-pay | Admitting: Advanced Practice Midwife

## 2020-11-26 ENCOUNTER — Ambulatory Visit (INDEPENDENT_AMBULATORY_CARE_PROVIDER_SITE_OTHER): Payer: Medicaid Other | Admitting: Advanced Practice Midwife

## 2020-11-26 ENCOUNTER — Other Ambulatory Visit: Payer: Self-pay

## 2020-11-26 DIAGNOSIS — J069 Acute upper respiratory infection, unspecified: Secondary | ICD-10-CM | POA: Insufficient documentation

## 2020-11-26 DIAGNOSIS — Z3480 Encounter for supervision of other normal pregnancy, unspecified trimester: Secondary | ICD-10-CM

## 2020-11-26 MED ORDER — PENICILLIN V POTASSIUM 500 MG PO TABS: 500.0000 mg | ORAL_TABLET | Freq: Three times a day (TID) | ORAL | 0 refills | Status: AC

## 2020-11-26 MED ORDER — PSEUDOEPHEDRINE HCL 30 MG PO TABS: 30.0000 mg | ORAL_TABLET | Freq: Four times a day (QID) | ORAL | 0 refills | Status: DC | PRN

## 2020-11-26 NOTE — Patient Instructions (Signed)

## 2020-11-26 NOTE — Progress Notes (Signed)
   PRENATAL VISIT NOTE  Subjective:  Natalie Wang is a 30 y.o. G3P1011 at [redacted]w[redacted]d being seen today for ongoing prenatal care.  She is currently monitored for the following issues for this low-risk pregnancy and has HSV-2 infection; Supervision of other normal pregnancy, antepartum; and URI (upper respiratory infection) on their problem list.  Patient reports URI and sore throat symptoms for one week, tried OTC remedies without relief.  Contractions: Not present. Vag. Bleeding: None.  Movement: Present. Denies leaking of fluid.   The following portions of the patient's history were reviewed and updated as appropriate: allergies, current medications, past family history, past medical history, past social history, past surgical history and problem list.   Objective:   Vitals:   11/26/20 1103  BP: (!) 110/58  Pulse: 97  Weight: 182 lb (82.6 kg)    Fetal Status: Fetal Heart Rate (bpm): 135 Fundal Height: 32 cm Movement: Present     General:  Alert, oriented and cooperative. Patient is in no acute distress. Sinus congestion, throat painful  Skin: Skin is warm and dry. No rash noted.   Cardiovascular: Normal heart rate noted  Respiratory: Normal respiratory effort, no problems with respiration noted  Abdomen: Soft, gravid, appropriate for gestational age.  Pain/Pressure: Present     Pelvic: Cervical exam deferred        Extremities: Normal range of motion.  Edema: Trace  Mental Status: Normal mood and affect. Normal behavior. Normal judgment and thought content.   Assessment and Plan:  Pregnancy: G3P1011 at [redacted]w[redacted]d 1. Supervision of other normal pregnancy, antepartum     Glucola normal   2. Viral upper respiratory tract infection      Recommend testing for strep and covid      Rx penicillin for presumed strep      Sudafed prn congestion, only as needed  Preterm labor symptoms and general obstetric precautions including but not limited to vaginal bleeding, contractions,  leaking of fluid and fetal movement were reviewed in detail with the patient. Please refer to After Visit Summary for other counseling recommendations.   Return in about 2 weeks (around 12/10/2020) for [facility].  Future Appointments  Date Time Provider Department Center  12/12/2020 11:00 AM Levie Heritage, DO [facility] None  12/19/2020 11:00 AM Levie Heritage, DO [facility] None  12/26/2020 11:00 AM Levie Heritage, DO [facility] None  01/02/2021 11:00 AM Levie Heritage, DO [facility] None  01/09/2021 11:00 AM Levie Heritage, DO [facility] None    Wynelle Bourgeois, CNM

## 2020-11-26 NOTE — Progress Notes (Signed)
Patient has had headache, sore throat, and congestion for last seven days.  Patient states she is also having fatigue and dizziness with nausea, and thirsty all the time for last four days.  Patient has tried tylenol, robitussin, theraflu, niquil, cloraseptic spray, tea with ginger, and lemon water and still not feeling well. Armandina Stammer RN

## 2020-12-12 ENCOUNTER — Ambulatory Visit (INDEPENDENT_AMBULATORY_CARE_PROVIDER_SITE_OTHER): Payer: Medicaid Other | Admitting: Family Medicine

## 2020-12-12 ENCOUNTER — Other Ambulatory Visit: Payer: Self-pay

## 2020-12-12 VITALS — BP 104/76 | HR 97 | Wt 189.0 lb

## 2020-12-12 DIAGNOSIS — Z3A35 35 weeks gestation of pregnancy: Secondary | ICD-10-CM

## 2020-12-12 DIAGNOSIS — B009 Herpesviral infection, unspecified: Secondary | ICD-10-CM

## 2020-12-12 DIAGNOSIS — Z3480 Encounter for supervision of other normal pregnancy, unspecified trimester: Secondary | ICD-10-CM

## 2020-12-12 MED ORDER — VALACYCLOVIR HCL 500 MG PO TABS: 500.0000 mg | ORAL_TABLET | Freq: Two times a day (BID) | ORAL | 3 refills | Status: DC

## 2020-12-12 NOTE — Progress Notes (Signed)
   PRENATAL VISIT NOTE  Subjective:  Natalie Wang is a 30 y.o. G3P1011 at [redacted]w[redacted]d being seen today for ongoing prenatal care.  She is currently monitored for the following issues for this low-risk pregnancy and has HSV-2 infection; Supervision of other normal pregnancy, antepartum; and URI (upper respiratory infection) on their problem list.  Patient reports fatigue.  Contractions: Irritability. Vag. Bleeding: None.  Movement: Present. Denies leaking of fluid.   The following portions of the patient's history were reviewed and updated as appropriate: allergies, current medications, past family history, past medical history, past social history, past surgical history and problem list.   Objective:   Vitals:   12/12/20 1103  BP: 104/76  Pulse: 97  Weight: 189 lb (85.7 kg)    Fetal Status: Fetal Heart Rate (bpm): 145 Fundal Height: 36 cm Movement: Present     General:  Alert, oriented and cooperative. Patient is in no acute distress.  Skin: Skin is warm and dry. No rash noted.   Cardiovascular: Normal heart rate noted  Respiratory: Normal respiratory effort, no problems with respiration noted  Abdomen: Soft, gravid, appropriate for gestational age.  Pain/Pressure: Present     Pelvic: Cervical exam deferred        Extremities: Normal range of motion.  Edema: Trace  Mental Status: Normal mood and affect. Normal behavior. Normal judgment and thought content.   Assessment and Plan:  Pregnancy: G3P1011 at [redacted]w[redacted]d 1. [redacted] weeks gestation of pregnancy  2. Supervision of other normal pregnancy, antepartum FHT and FH normal Expressed interest in waterbirth. Discussed that she needs to take the waterbirth class - next one is about 3 weeks away. She will look into this. Will also schedule with midwife.  3. HSV-2 infection Valtrex. - valACYclovir (VALTREX) 500 MG tablet; Take 1 tablet (500 mg total) by mouth 2 (two) times daily.  Dispense: 60 tablet; Refill: 3  Preterm labor  symptoms and general obstetric precautions including but not limited to vaginal bleeding, contractions, leaking of fluid and fetal movement were reviewed in detail with the patient. Please refer to After Visit Summary for other counseling recommendations.   No follow-ups on file.  Future Appointments  Date Time Provider Department Center  12/19/2020 11:00 AM Levie Heritage, DO [facility] None  12/24/2020  9:55 AM Aviva Signs, CNM [facility] None  01/02/2021 11:00 AM Levie Heritage, DO [facility] None  01/09/2021 11:00 AM Levie Heritage, DO [facility] None    Levie Heritage, DO

## 2020-12-19 ENCOUNTER — Ambulatory Visit (INDEPENDENT_AMBULATORY_CARE_PROVIDER_SITE_OTHER): Payer: Medicaid Other | Admitting: Family Medicine

## 2020-12-19 ENCOUNTER — Other Ambulatory Visit: Payer: Self-pay

## 2020-12-19 ENCOUNTER — Other Ambulatory Visit (HOSPITAL_COMMUNITY)
Admission: RE | Admit: 2020-12-19 | Discharge: 2020-12-19 | Disposition: A | Discharge status: Home / Self Care | Payer: Medicaid Other | Source: Ambulatory Visit | Attending: Family Medicine | Admitting: Family Medicine

## 2020-12-19 VITALS — BP 112/59 | HR 66 | Wt 190.0 lb

## 2020-12-19 DIAGNOSIS — Z3480 Encounter for supervision of other normal pregnancy, unspecified trimester: Secondary | ICD-10-CM | POA: Diagnosis not present

## 2020-12-19 DIAGNOSIS — Z3A36 36 weeks gestation of pregnancy: Secondary | ICD-10-CM

## 2020-12-19 DIAGNOSIS — B009 Herpesviral infection, unspecified: Secondary | ICD-10-CM

## 2020-12-19 DIAGNOSIS — O98313 Other infections with a predominantly sexual mode of transmission complicating pregnancy, third trimester: Secondary | ICD-10-CM

## 2020-12-19 NOTE — Progress Notes (Signed)
   PRENATAL VISIT NOTE  Subjective:  Natalie Wang is a 30 y.o. G3P1011 at [redacted]w[redacted]d being seen today for ongoing prenatal care.  She is currently monitored for the following issues for this low-risk pregnancy and has HSV-2 infection; Supervision of other normal pregnancy, antepartum; and URI (upper respiratory infection) on their problem list.  Patient reports occasional contractions.  Contractions: Not present. Vag. Bleeding: None.  Movement: Present. Denies leaking of fluid.   The following portions of the patient's history were reviewed and updated as appropriate: allergies, current medications, past family history, past medical history, past social history, past surgical history and problem list.   Objective:   Vitals:   12/19/20 1102  BP: (!) 112/59  Pulse: 66  Weight: 190 lb (86.2 kg)    Fetal Status: Fetal Heart Rate (bpm): 153 Fundal Height: 37 cm Movement: Present  Presentation: Vertex  General:  Alert, oriented and cooperative. Patient is in no acute distress.  Skin: Skin is warm and dry. No rash noted.   Cardiovascular: Normal heart rate noted  Respiratory: Normal respiratory effort, no problems with respiration noted  Abdomen: Soft, gravid, appropriate for gestational age.  Pain/Pressure: Present     Pelvic: Cervical exam performed in the presence of a chaperone Dilation: 2 Effacement (%): 60 Station: -3  Extremities: Normal range of motion.  Edema: None  Mental Status: Normal mood and affect. Normal behavior. Normal judgment and thought content.   Assessment and Plan:  Pregnancy: G3P1011 at [redacted]w[redacted]d 1. [redacted] weeks gestation of pregnancy  2. Supervision of other normal pregnancy, antepartum FHT and FH normal  3. 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 Please refer to After Visit  Summary for other counseling recommendations.   No follow-ups on file.  Future Appointments  Date Time Provider Department Center  12/24/2020  9:55 AM Aviva Signs, CNM [facility] None  01/02/2021 11:00 AM Levie Heritage, DO [facility] None  01/09/2021 11:00 AM Levie Heritage, DO [facility] None    Levie Heritage, DO

## 2020-12-20 LAB — GC/CHLAMYDIA PROBE AMP (~~LOC~~) NOT AT ARMC
Chlamydia: NEGATIVE
Comment: NEGATIVE
Comment: NORMAL
Neisseria Gonorrhea: NEGATIVE

## 2020-12-23 LAB — CULTURE, BETA STREP (GROUP B ONLY): Strep Gp B Culture: NEGATIVE

## 2020-12-24 ENCOUNTER — Other Ambulatory Visit: Payer: Self-pay

## 2020-12-24 ENCOUNTER — Ambulatory Visit (INDEPENDENT_AMBULATORY_CARE_PROVIDER_SITE_OTHER): Payer: Medicaid Other | Admitting: Advanced Practice Midwife

## 2020-12-24 ENCOUNTER — Encounter: Payer: Self-pay | Admitting: Advanced Practice Midwife

## 2020-12-24 DIAGNOSIS — J069 Acute upper respiratory infection, unspecified: Secondary | ICD-10-CM

## 2020-12-24 DIAGNOSIS — Z3480 Encounter for supervision of other normal pregnancy, unspecified trimester: Secondary | ICD-10-CM

## 2020-12-24 NOTE — Patient Instructions (Signed)
Thinking About Waterbirth???  You must attend a Waterbirth class at [facility]  3rd Wednesday of every month from 7-9pm  Free  Register by calling [phone] or online at [email or web address]  Bring us the certificate from the class  Waterbirth supplies needed for [facility] patients:  Our practice has a Birth Pool in a Box tub at the hospital that you can borrow  You will need to purchase an accessory kit that has all needed supplies through [facility] (  ) or online  Or you can purchase the supplies separately: o Single-use disposable tub liner for Birth Pool in a Box (REGULAR size) o New garden hose labeled "lead-free", "suitable for drinking water", "non-toxic" OR "water potable" o Garden hose to remove the dirty water o Electric drain pump to remove water (We recommend 792 gallon per hour or greater pump.)  o Fish net o Bathing suit top (optional) o Long-handled mirror (optional)  [email or web address] sells tubs for ~ $120 if you would rather purchase your own tub  [employer] [facility] ([email or web address]) $275 for tub rental/set-up & take down/kit   Things that would prevent you from having a waterbirth:  Premature, <37wks  Previous cesarean birth  Presence of thick meconium-stained fluid  Multiple gestation (Twins, triplets, etc.)  Uncontrolled diabetes  Hypertension  Heavy vaginal bleeding  Non-reassuring fetal heart rate  Active infection (MRSA, etc.)  If your labor has to be induced  Other risk issues identified by your obstetrical provider    

## 2020-12-24 NOTE — Progress Notes (Signed)
PRENATAL VISIT NOTE  Subjective:  Natalie Wang is a 30 y.o. G3P1011 at [redacted]w[redacted]d being seen today for ongoing prenatal care.  She is currently monitored for the following issues for this low-risk pregnancy and has HSV-2 infection; Supervision of other normal pregnancy, antepartum; and URI (upper respiratory infection) on their problem list.  Patient reports no complaints.  Contractions: Not present. Vag. Bleeding: None.  Movement: Present. Denies leaking of fluid.   The following portions of the patient's history were reviewed and updated as appropriate: allergies, current medications, past family history, past medical history, past social history, past surgical history and problem list.   Objective:   Vitals:   12/24/20 1005  BP: 117/69  Pulse: 87  Weight: 190 lb (86.2 kg)    Fetal Status:     Movement: Present     General:  Alert, oriented and cooperative. Patient is in no acute distress.  Skin: Skin is warm and dry. No rash noted.   Cardiovascular: Normal heart rate noted  Respiratory: Normal respiratory effort, no problems with respiration noted  Abdomen: Soft, gravid, appropriate for gestational age.  Pain/Pressure: Present     Pelvic: Cervical exam deferred        Extremities: Normal range of motion.  Edema: None  Mental Status: Normal mood and affect. Normal behavior. Normal judgment and thought content.   Assessment and Plan:  Pregnancy: G3P1011 at [redacted]w[redacted]d 1. Viral upper respiratory tract infection     Resolved  2. Supervision of other normal pregnancy, antepartum     Has WB class next week,will sign consent afterward     Issues discussed below     Thinking About Waterbirth???  You must attend a Doren Custard class at [facility]  3rd Wednesday of every month from 7-9pm  [employer] by calling [phone] or online at [email or web address]  Bring [place] the certificate from the class  Waterbirth supplies needed for [employer] patients:  Our practice has a Heritage manager in a Box tub at the hospital that you can borrow  You will need to purchase an accessory kit that has all needed supplies through [employer] (  ) or online  Or you can purchase the supplies separately: o Single-use disposable tub liner for [employer] in a Box (REGULAR size) o New garden hose labeled "lead-free", "suitable for drinking water", "non-toxic" OR "water potable" o Garden hose to remove the dirty water o Electric drain pump to remove water (We recommend 792 gallon per hour or greater pump.)  o Fish net o Bathing suit top (optional) o Long-handled mirror (optional)  [employer] sells tubs for ~ $120 if you would rather purchase your own tub  [employer] (www.thelaborladies.com) $275 for tub rental/set-up & take down/kit   Things that would prevent you from having a waterbirth:  Premature, <37wks  Previous cesarean birth  Presence of thick meconium-stained fluid  Multiple gestation (Twins, triplets, etc.)  Uncontrolled diabetes  Hypertension  Heavy vaginal bleeding  Non-reassuring fetal heart rate  Active infection (MRSA, etc.)  If your labor has to be induced  Other risk issues identified by your obstetrical provider   Preterm labor symptoms and general obstetric precautions including but not limited to vaginal bleeding, contractions, leaking of fluid and fetal movement were reviewed in detail with the patient. Please refer to After Visit Summary for other counseling recommendations.   Return in about 1 week (around 12/31/2020) for [facility].  Future Appointments  Date Time Provider  [place]  01/02/2021 11:00 AM Truett Mainland, DO [facility] None  01/09/2021 11:00 AM Truett Mainland, DO [facility] None    Hansel Feinstein, CNM

## 2020-12-26 ENCOUNTER — Encounter: Payer: Medicaid Other | Admitting: Family Medicine

## 2021-01-02 ENCOUNTER — Ambulatory Visit (INDEPENDENT_AMBULATORY_CARE_PROVIDER_SITE_OTHER): Payer: Medicaid Other | Admitting: Family Medicine

## 2021-01-02 ENCOUNTER — Other Ambulatory Visit: Payer: Self-pay

## 2021-01-02 VITALS — BP 120/67 | HR 72 | Wt 193.0 lb

## 2021-01-02 DIAGNOSIS — B009 Herpesviral infection, unspecified: Secondary | ICD-10-CM

## 2021-01-02 DIAGNOSIS — Z3A38 38 weeks gestation of pregnancy: Secondary | ICD-10-CM

## 2021-01-02 DIAGNOSIS — Z3480 Encounter for supervision of other normal pregnancy, unspecified trimester: Secondary | ICD-10-CM

## 2021-01-02 NOTE — Progress Notes (Signed)
   PRENATAL VISIT NOTE  Subjective:  Natalie Wang is a 30 y.o. G3P1011 at [redacted]w[redacted]d being seen today for ongoing prenatal care.  She is currently monitored for the following issues for this low-risk pregnancy and has HSV-2 infection; Supervision of other normal pregnancy, antepartum; and URI (upper respiratory infection) on their problem list.  Patient reports occasional contractions.  Contractions: Irregular.  .  Movement: Present. Denies leaking of fluid.   The following portions of the patient's history were reviewed and updated as appropriate: allergies, current medications, past family history, past medical history, past social history, past surgical history and problem list.   Objective:   Vitals:   01/02/21 1108  BP: 120/67  Pulse: 72  Weight: 193 lb (87.5 kg)    Fetal Status: Fetal Heart Rate (bpm): 132 Fundal Height: 38 cm Movement: Present  Presentation: Vertex  General:  Alert, oriented and cooperative. Patient is in no acute distress.  Skin: Skin is warm and dry. No rash noted.   Cardiovascular: Normal heart rate noted  Respiratory: Normal respiratory effort, no problems with respiration noted  Abdomen: Soft, gravid, appropriate for gestational age.  Pain/Pressure: Present     Pelvic: Cervical exam performed in the presence of a chaperone Dilation: 3 Effacement (%): 60 Station: -2  Extremities: Normal range of motion.  Edema: None  Mental Status: Normal mood and affect. Normal behavior. Normal judgment and thought content.   Assessment and Plan:  Pregnancy: G3P1011 at [redacted]w[redacted]d 1. [redacted] weeks gestation of pregnancy  2. Supervision of other normal pregnancy, antepartum FHT and FH normal  3. HSV-2 infection valtrex  Preterm labor symptoms and general obstetric precautions including but not limited to vaginal bleeding, contractions, leaking of fluid and fetal movement were reviewed in detail with the patient. Please refer to After Visit Summary for other  counseling recommendations.   No follow-ups on file.  Future Appointments  Date Time Provider Department Center  01/09/2021 11:00 AM Levie Heritage, DO [facility] None    Levie Heritage, DO

## 2021-01-05 ENCOUNTER — Other Ambulatory Visit: Payer: Self-pay

## 2021-01-05 ENCOUNTER — Inpatient Hospital Stay (HOSPITAL_COMMUNITY)
Admission: AD | Admit: 2021-01-05 | Discharge: 2021-01-06 | DRG: 807 | Disposition: A | Discharge status: Home / Self Care | Payer: Medicaid Other | Attending: Obstetrics and Gynecology | Admitting: Obstetrics and Gynecology

## 2021-01-05 ENCOUNTER — Inpatient Hospital Stay (HOSPITAL_COMMUNITY): Payer: Medicaid Other | Admitting: Anesthesiology

## 2021-01-05 ENCOUNTER — Encounter (HOSPITAL_COMMUNITY): Payer: Self-pay | Admitting: Obstetrics and Gynecology

## 2021-01-05 DIAGNOSIS — O26893 Other specified pregnancy related conditions, third trimester: Secondary | ICD-10-CM | POA: Diagnosis present

## 2021-01-05 DIAGNOSIS — Z30014 Encounter for initial prescription of intrauterine contraceptive device: Secondary | ICD-10-CM | POA: Diagnosis not present

## 2021-01-05 DIAGNOSIS — Z20822 Contact with and (suspected) exposure to covid-19: Secondary | ICD-10-CM | POA: Diagnosis present

## 2021-01-05 DIAGNOSIS — Z30430 Encounter for insertion of intrauterine contraceptive device: Secondary | ICD-10-CM

## 2021-01-05 DIAGNOSIS — A609 Anogenital herpesviral infection, unspecified: Secondary | ICD-10-CM

## 2021-01-05 DIAGNOSIS — O4202 Full-term premature rupture of membranes, onset of labor within 24 hours of rupture: Secondary | ICD-10-CM

## 2021-01-05 DIAGNOSIS — O9832 Other infections with a predominantly sexual mode of transmission complicating childbirth: Principal | ICD-10-CM | POA: Diagnosis present

## 2021-01-05 DIAGNOSIS — Z3A38 38 weeks gestation of pregnancy: Secondary | ICD-10-CM

## 2021-01-05 DIAGNOSIS — Z975 Presence of (intrauterine) contraceptive device: Secondary | ICD-10-CM

## 2021-01-05 DIAGNOSIS — Z3480 Encounter for supervision of other normal pregnancy, unspecified trimester: Secondary | ICD-10-CM

## 2021-01-05 DIAGNOSIS — A6000 Herpesviral infection of urogenital system, unspecified: Secondary | ICD-10-CM | POA: Diagnosis present

## 2021-01-05 DIAGNOSIS — B009 Herpesviral infection, unspecified: Secondary | ICD-10-CM | POA: Diagnosis present

## 2021-01-05 DIAGNOSIS — Z87891 Personal history of nicotine dependence: Secondary | ICD-10-CM | POA: Diagnosis not present

## 2021-01-05 LAB — RESP PANEL BY RT-PCR (FLU A&B, COVID) ARPGX2
Influenza A by PCR: NEGATIVE
Influenza B by PCR: NEGATIVE
SARS Coronavirus 2 by RT PCR: NEGATIVE

## 2021-01-05 LAB — CBC
HCT: 36 % (ref 36.0–46.0)
Hemoglobin: 12.5 g/dL (ref 12.0–15.0)
MCH: 33.2 pg (ref 26.0–34.0)
MCHC: 34.7 g/dL (ref 30.0–36.0)
MCV: 95.5 fL (ref 80.0–100.0)
Platelets: 283 10*3/uL (ref 150–400)
RBC: 3.77 MIL/uL — ABNORMAL LOW (ref 3.87–5.11)
RDW: 13.3 % (ref 11.5–15.5)
WBC: 10.5 10*3/uL (ref 4.0–10.5)
nRBC: 0 % (ref 0.0–0.2)

## 2021-01-05 LAB — TYPE AND SCREEN
ABO/RH(D): O POS
Antibody Screen: NEGATIVE

## 2021-01-05 LAB — POCT FERN TEST: POCT Fern Test: POSITIVE

## 2021-01-05 LAB — RPR: RPR Ser Ql: NONREACTIVE

## 2021-01-05 MED ORDER — LACTATED RINGERS IV SOLN: 500.0000 mL | INTRAVENOUS | Status: DC | PRN

## 2021-01-05 MED ORDER — SIMETHICONE 80 MG PO CHEW: 80.0000 mg | CHEWABLE_TABLET | ORAL | Status: DC | PRN

## 2021-01-05 MED ORDER — PHENYLEPHRINE 40 MCG/ML (10ML) SYRINGE FOR IV PUSH (FOR BLOOD PRESSURE SUPPORT): 80.0000 ug | PREFILLED_SYRINGE | INTRAVENOUS | Status: DC | PRN

## 2021-01-05 MED ORDER — LEVONORGESTREL 19.5 MCG/DAY IU IUD: INTRAUTERINE_SYSTEM | Freq: Once | INTRAUTERINE | Status: DC

## 2021-01-05 MED ORDER — DIPHENHYDRAMINE HCL 50 MG/ML IJ SOLN: 12.5000 mg | INTRAMUSCULAR | Status: DC | PRN

## 2021-01-05 MED ORDER — ONDANSETRON HCL 4 MG PO TABS: 4.0000 mg | ORAL_TABLET | ORAL | Status: DC | PRN

## 2021-01-05 MED ORDER — ACETAMINOPHEN 325 MG PO TABS
650.0000 mg | ORAL_TABLET | ORAL | Status: DC | PRN
  Administered 2021-01-05 – 2021-01-06 (×3): 650 mg via ORAL
  Filled 2021-01-05 (×3): qty 2

## 2021-01-05 MED ORDER — ACETAMINOPHEN 325 MG PO TABS: 650.0000 mg | ORAL_TABLET | ORAL | Status: DC | PRN

## 2021-01-05 MED ORDER — MEASLES, MUMPS & RUBELLA VAC IJ SOLR: 0.5000 mL | Freq: Once | INTRAMUSCULAR | Status: DC

## 2021-01-05 MED ORDER — OXYTOCIN-SODIUM CHLORIDE 30-0.9 UT/500ML-% IV SOLN
2.5000 [IU]/h | INTRAVENOUS | Status: DC
  Administered 2021-01-05: 2.5 [IU]/h via INTRAVENOUS
  Filled 2021-01-05: qty 500

## 2021-01-05 MED ORDER — LIDOCAINE HCL (PF) 1 % IJ SOLN: 30.0000 mL | INTRAMUSCULAR | Status: DC | PRN

## 2021-01-05 MED ORDER — EPHEDRINE 5 MG/ML INJ: 10.0000 mg | INTRAVENOUS | Status: DC | PRN

## 2021-01-05 MED ORDER — SODIUM BICARBONATE 8.4 % IV SOLN
INTRAVENOUS | Status: DC | PRN
  Administered 2021-01-05: 4 mL via EPIDURAL
  Administered 2021-01-05: 5 mL via EPIDURAL

## 2021-01-05 MED ORDER — OXYTOCIN BOLUS FROM INFUSION
333.0000 mL | Freq: Once | INTRAVENOUS | Status: AC
  Administered 2021-01-05: 333 mL via INTRAVENOUS

## 2021-01-05 MED ORDER — ONDANSETRON HCL 4 MG/2ML IJ SOLN: 4.0000 mg | Freq: Four times a day (QID) | INTRAMUSCULAR | Status: DC | PRN

## 2021-01-05 MED ORDER — IBUPROFEN 600 MG PO TABS
600.0000 mg | ORAL_TABLET | Freq: Four times a day (QID) | ORAL | Status: DC
  Administered 2021-01-05 – 2021-01-06 (×5): 600 mg via ORAL
  Filled 2021-01-05 (×5): qty 1

## 2021-01-05 MED ORDER — FENTANYL CITRATE (PF) 100 MCG/2ML IJ SOLN
100.0000 ug | INTRAMUSCULAR | Status: DC | PRN
Start: 2021-01-05 — End: 2021-01-05

## 2021-01-05 MED ORDER — DIBUCAINE (PERIANAL) 1 % EX OINT: 1.0000 "application " | TOPICAL_OINTMENT | CUTANEOUS | Status: DC | PRN

## 2021-01-05 MED ORDER — SENNOSIDES-DOCUSATE SODIUM 8.6-50 MG PO TABS
2.0000 | ORAL_TABLET | ORAL | Status: DC
  Administered 2021-01-05 – 2021-01-06 (×2): 2 via ORAL
  Filled 2021-01-05 (×2): qty 2

## 2021-01-05 MED ORDER — LACTATED RINGERS IV SOLN
500.0000 mL | Freq: Once | INTRAVENOUS | Status: AC
  Administered 2021-01-05: 500 mL via INTRAVENOUS

## 2021-01-05 MED ORDER — LACTATED RINGERS IV SOLN: INTRAVENOUS | Status: DC

## 2021-01-05 MED ORDER — LIDOCAINE HCL (PF) 1 % IJ SOLN
INTRAMUSCULAR | Status: DC | PRN
  Administered 2021-01-05 (×2): 4 mL via EPIDURAL

## 2021-01-05 MED ORDER — BENZOCAINE-MENTHOL 20-0.5 % EX AERO: 1.0000 "application " | INHALATION_SPRAY | CUTANEOUS | Status: DC | PRN

## 2021-01-05 MED ORDER — TETANUS-DIPHTH-ACELL PERTUSSIS 5-2.5-18.5 LF-MCG/0.5 IM SUSY: 0.5000 mL | PREFILLED_SYRINGE | Freq: Once | INTRAMUSCULAR | Status: DC

## 2021-01-05 MED ORDER — FENTANYL-BUPIVACAINE-NACL 0.5-0.125-0.9 MG/250ML-% EP SOLN
12.0000 mL/h | EPIDURAL | Status: DC | PRN
  Administered 2021-01-05: 12 mL/h via EPIDURAL

## 2021-01-05 MED ORDER — OXYCODONE-ACETAMINOPHEN 5-325 MG PO TABS: 1.0000 | ORAL_TABLET | ORAL | Status: DC | PRN

## 2021-01-05 MED ORDER — COCONUT OIL OIL: 1.0000 "application " | TOPICAL_OIL | Status: DC | PRN

## 2021-01-05 MED ORDER — TRANEXAMIC ACID-NACL 1000-0.7 MG/100ML-% IV SOLN
INTRAVENOUS | Status: AC
  Administered 2021-01-05: 1000 mg
  Filled 2021-01-05: qty 100

## 2021-01-05 MED ORDER — TRANEXAMIC ACID-NACL 1000-0.7 MG/100ML-% IV SOLN: 1000.0000 mg | INTRAVENOUS | Status: DC

## 2021-01-05 MED ORDER — LEVONORGESTREL 19.5 MCG/DAY IU IUD
INTRAUTERINE_SYSTEM | INTRAUTERINE | Status: AC
  Administered 2021-01-05: 1
  Filled 2021-01-05: qty 1

## 2021-01-05 MED ORDER — PRENATAL MULTIVITAMIN CH
1.0000 | ORAL_TABLET | Freq: Every day | ORAL | Status: DC
  Administered 2021-01-05 – 2021-01-06 (×2): 1 via ORAL
  Filled 2021-01-05 (×2): qty 1

## 2021-01-05 MED ORDER — WITCH HAZEL-GLYCERIN EX PADS: 1.0000 "application " | MEDICATED_PAD | CUTANEOUS | Status: DC | PRN

## 2021-01-05 MED ORDER — ONDANSETRON HCL 4 MG/2ML IJ SOLN
4.0000 mg | INTRAMUSCULAR | Status: DC | PRN
Start: 2021-01-05 — End: 2021-01-06

## 2021-01-05 MED ORDER — FENTANYL CITRATE (PF) 100 MCG/2ML IJ SOLN
INTRAMUSCULAR | Status: AC
  Administered 2021-01-05: 100 ug via INTRAVENOUS
  Filled 2021-01-05: qty 2

## 2021-01-05 MED ORDER — FENTANYL-BUPIVACAINE-NACL 0.5-0.125-0.9 MG/250ML-% EP SOLN
EPIDURAL | Status: AC
  Filled 2021-01-05: qty 250

## 2021-01-05 MED ORDER — OXYCODONE-ACETAMINOPHEN 5-325 MG PO TABS: 2.0000 | ORAL_TABLET | ORAL | Status: DC | PRN

## 2021-01-05 MED ORDER — SOD CITRATE-CITRIC ACID 500-334 MG/5ML PO SOLN: 30.0000 mL | ORAL | Status: DC | PRN

## 2021-01-05 MED ORDER — DIPHENHYDRAMINE HCL 25 MG PO CAPS: 25.0000 mg | ORAL_CAPSULE | Freq: Four times a day (QID) | ORAL | Status: DC | PRN

## 2021-01-05 NOTE — Discharge Summary (Signed)
   Postpartum Discharge Summary  Date of Service updated 01/06/21     Patient Name: Natalie Wang DOB: 01/22/1991 MRN: [id number]  Date of admission: 01/05/2021 Delivery date:01/05/2021  Delivering provider: FIRESTONE, ALICIA C  Date of discharge: 01/06/2021  Admitting diagnosis: Indication for care in labor and delivery, antepartum [O75.9] Intrauterine pregnancy: [redacted]w[redacted]d     Secondary diagnosis:  Active Problems:   HSV-2 infection   Vaginal delivery   IUD (intrauterine device) in place   Supervision of other normal pregnancy, antepartum   Indication for care in labor and delivery, antepartum  Additional problems: none    Discharge diagnosis: Term Pregnancy Delivered                                              Post partum procedures:post placental liletta placed Augmentation: N/A Complications: None  Hospital course: Onset of Labor With Vaginal Delivery      30 y.o. yo G3P1011 at [redacted]w[redacted]d was admitted in Active Labor on 01/05/2021. Patient had an uncomplicated labor course as follows:  Membrane Rupture Time/Date: 8:00 AM ,01/04/2021   Delivery Method:Vaginal, Spontaneous  Episiotomy: None  Lacerations:  None  Patient had an uncomplicated postpartum course.  She is ambulating, tolerating a regular diet, passing flatus, and urinating well. Patient is discharged home in stable condition on 01/06/21.  Newborn Data: Birth date:01/05/2021  Birth time:6:55 AM  Gender:Female  Living status:Living  Apgars:8 ,9  Weight:3850 g   Magnesium Sulfate received: No BMZ received: No Rhophylac:N/A MMR:N/A T-DaP:Given prenatally Flu: No Transfusion:No  Physical exam  Vitals:   01/05/21 1723 01/05/21 2023 01/06/21 0030 01/06/21 0549  BP: 125/76 127/74 124/67 126/76  Pulse: 78 72 71 79  Resp: 20 16 18 16  Temp: 98.3 F (36.8 C) 98.1 F (36.7 C) 98.4 F (36.9 C) 97.7 F (36.5 C)  TempSrc: Oral Axillary Oral Oral  SpO2:  99% 98% 99%  Weight:      Height:       General:  alert and cooperative Lochia: appropriate Uterine Fundus: firm Incision: N/A DVT Evaluation: No evidence of DVT seen on physical exam. Labs: Lab Results  Component Value Date   WBC 10.5 01/05/2021   HGB 12.5 01/05/2021   HCT 36.0 01/05/2021   MCV 95.5 01/05/2021   PLT 283 01/05/2021   CMP Latest Ref Rng & Units 03/04/2019  Glucose 70 - 99 mg/dL 129(H)  BUN 6 - 20 mg/dL 7  Creatinine 0.44 - 1.00 mg/dL 0.87  Sodium 135 - 145 mmol/L 135  Potassium 3.5 - 5.1 mmol/L 3.8  Chloride 98 - 111 mmol/L 104  CO2 22 - 32 mmol/L 20(L)  Calcium 8.9 - 10.3 mg/dL 8.7(L)  Total Protein 6.5 - 8.1 g/dL 5.1(L)  Total Bilirubin 0.3 - 1.2 mg/dL 0.5  Alkaline Phos 38 - 126 U/L 112  AST 15 - 41 U/L 26  ALT 0 - 44 U/L 14   Edinburgh Score: Edinburgh Postnatal Depression Scale Screening Tool 01/05/2021  I have been able to laugh and see the funny side of things. 0  I have looked forward with enjoyment to things. 0  I have blamed myself unnecessarily when things went wrong. 0  I have been anxious or worried for no good reason. 1  I have felt scared or panicky for no good reason. 1  Things have been getting on top of me.   1  I have been so unhappy that I have had difficulty sleeping. 0  I have felt sad or miserable. 1  I have been so unhappy that I have been crying. 0  The thought of harming myself has occurred to me. 0  Edinburgh Postnatal Depression Scale Total 4     After visit meds:  Allergies as of 01/06/2021   No Known Allergies     Medication List    STOP taking these medications   pseudoephedrine 30 MG tablet Commonly known as: Sudafed     TAKE these medications   albuterol 108 (90 Base) MCG/ACT inhaler Commonly known as: VENTOLIN HFA Inhale 2 puffs into the lungs every 6 (six) hours as needed for wheezing or shortness of breath.   Budesonide 90 MCG/ACT inhaler Inhale 1 puff into the lungs 2 (two) times daily.   ibuprofen 600 MG tablet Commonly known as: ADVIL Take 1 tablet  (600 mg total) by mouth every 6 (six) hours.   prenatal multivitamin Tabs tablet Take 1 tablet by mouth daily at 12 noon.   valACYclovir 500 MG tablet Commonly known as: VALTREX Take 1 tablet (500 mg total) by mouth 2 (two) times daily.        Discharge home in stable condition Infant Feeding: Breast Infant Disposition:home with mother Discharge instruction: per After Visit Summary and Postpartum booklet. Activity: Advance as tolerated. Pelvic rest for 6 weeks.  Diet: routine diet Future Appointments: Future Appointments  Date Time Provider Department Center  02/19/2021 11:00 AM Stinson, Jacob J, DO [facility] None   Follow up Visit:  Follow-up Information    [facility] [place] Follow up.   Specialty: Obstetrics and Gynecology Contact information: [address] [place] [address] [phone]             Message sent to [facility] 01/05/21 by [employer].   Please schedule this patient for a In person postpartum visit in 6 weeks with the following provider: Any provider. Additional Postpartum F/U:none  Low risk pregnancy complicated by: n/a Delivery mode:  Vaginal, Spontaneous  Anticipated Birth Control:  PP IUD placed, string check at pp visit   01/06/2021 Jennifer Rasch, NP   

## 2021-01-05 NOTE — Anesthesia Preprocedure Evaluation (Signed)
Anesthesia Evaluation  Patient identified by MRN, date of birth, ID band Patient awake    Reviewed: Allergy & Precautions, Patient's Chart, lab work & pertinent test results  Airway Mallampati: II  TM Distance: >3 FB Neck ROM: Full    Dental no notable dental hx. (+) Teeth Intact   Pulmonary asthma , former smoker,    Pulmonary exam normal breath sounds clear to auscultation       Cardiovascular negative cardio ROS Normal cardiovascular exam Rhythm:Regular Rate:Normal     Neuro/Psych Anxiety negative neurological ROS     GI/Hepatic Neg liver ROS, GERD  ,  Endo/Other  Obesity  Renal/GU negative Renal ROS  negative genitourinary   Musculoskeletal negative musculoskeletal ROS (+)   Abdominal (+) + obese,   Peds  Hematology negative hematology ROS (+)   Anesthesia Other Findings   Reproductive/Obstetrics (+) Pregnancy HSV                             Anesthesia Physical Anesthesia Plan  ASA: II  Anesthesia Plan: Epidural   Post-op Pain Management:    Induction:   PONV Risk Score and Plan:   Airway Management Planned: Natural Airway  Additional Equipment:   Intra-op Plan:   Post-operative Plan:   Informed Consent: I have reviewed the patients History and Physical, chart, labs and discussed the procedure including the risks, benefits and alternatives for the proposed anesthesia with the patient or authorized representative who has indicated his/her understanding and acceptance.       Plan Discussed with: Anesthesiologist  Anesthesia Plan Comments:         Anesthesia Quick Evaluation

## 2021-01-05 NOTE — MAU Note (Signed)
Pt reports leaking fluid since 8 am yesterday, contractions,

## 2021-01-05 NOTE — H&P (Signed)
OBSTETRIC ADMISSION HISTORY AND PHYSICAL  Natalie Wang is a 30 y.o. female G3P1011 with IUP at [redacted]w[redacted]d by 8 wk u/s presenting for SOL/SROM @0800  4/23. She reports +FMs, no VB, no blurry vision, headaches or peripheral edema, and RUQ pain.  She plans on breast feeding. She is undecided for birth control. She received her prenatal care at [facility].   Dating: By 8 wk u/s --->  Estimated Date of Delivery: 01/15/21  Sono:    10/10/20@[redacted]w[redacted]d , CWD, normal anatomy, cephalic presentation, posterior placental lie, 1053g, 84% EFW   Prenatal History/Complications:  HSV (no outbreaks, on valtrex)  Past Medical History: Past Medical History:  Diagnosis Date  . Anxiety   . Asthma    inhaler but not regular use  . Herpes     Past Surgical History: Past Surgical History:  Procedure Laterality Date  . APPENDECTOMY      Obstetrical History: OB History    Gravida  3   Para  1   Term  1   Preterm      AB  1   Living  1     SAB  1   IAB      Ectopic      Multiple  0   Live Births  1           Social History Social History   Socioeconomic History  . Marital status: Married    Spouse name: Not on file  . Number of children: Not on file  . Years of education: Not on file  . Highest education level: Not on file  Occupational History  . Not on file  Tobacco Use  . Smoking status: Former Smoker    Quit date: 02/27/2018    Years since quitting: 2.8  . Smokeless tobacco: Never Used  Vaping Use  . Vaping Use: Never used  Substance and Sexual Activity  . Alcohol use: Not Currently  . Drug use: Not Currently  . Sexual activity: Yes    Birth control/protection: None  Other Topics Concern  . Not on file  Social History Narrative  . Not on file   Social Determinants of Health   Financial Resource Strain: Not on file  Food Insecurity: Not on file  Transportation Needs: Not on file  Physical Activity: Not on file  Stress: Not on file  Social Connections: Not  on file    Family History: Family History  Problem Relation Age of Onset  . Cancer Neg Hx   . Hypertension Neg Hx     Allergies: No Known Allergies  Medications Prior to Admission  Medication Sig Dispense Refill Last Dose  . albuterol (VENTOLIN HFA) 108 (90 Base) MCG/ACT inhaler Inhale 2 puffs into the lungs every 6 (six) hours as needed for wheezing or shortness of breath. 1 each 3   . Budesonide 90 MCG/ACT inhaler Inhale 1 puff into the lungs 2 (two) times daily. 1 each 3   . Prenatal Vit-Fe Fumarate-FA (PRENATAL MULTIVITAMIN) TABS tablet Take 1 tablet by mouth daily at 12 noon.     . pseudoephedrine (SUDAFED) 30 MG tablet Take 1-2 tablets (30-60 mg total) by mouth every 6 (six) hours as needed for congestion. (Patient not taking: Reported on 12/19/2020) 30 tablet 0   . valACYclovir (VALTREX) 500 MG tablet Take 1 tablet (500 mg total) by mouth 2 (two) times daily. 60 tablet 3      Review of Systems   All systems reviewed and negative except as stated in  HPI  Blood pressure 127/71, pulse 70, resp. rate 20, height 5\' 6"  (1.676 m), weight 90 kg, last menstrual period 03/28/2020, SpO2 100 %, currently breastfeeding. General appearance: alert, cooperative and mild distress Lungs: normal respiratory effort Heart: regular rate and rhythm Abdomen: soft, non-tender; gravid Pelvic: as noted below, no lesions noted on external vaginal exam Extremities: Homans sign is negative, no sign of DVT  Presentation: cephalic by MAU RN exam Fetal monitoringBaseline: 135 bpm, Variability: Good {> 6 bpm), Accelerations: Reactive and Decelerations: Absent Uterine activityFrequency: Every 2-3 minutes Dilation: 5 Effacement (%): 70 Station: -2 Exam by:: Jeanetta,RN   Prenatal labs: ABO, Rh: O/Positive/-- (09/17 1115) Antibody: Negative (09/17 1115) Rubella: 2.24 (09/17 1115) RPR: Non Reactive (02/03 0911)  HBsAg: Negative (09/17 1115)  HIV: Non Reactive (02/03 0911)  GBS: Negative/-- (04/07  1145)  2 hr Glucola passed Genetic screening  Low risk Anatomy [birth year]1998 normal  Prenatal Transfer Tool  Maternal Diabetes: No Genetic Screening: Normal Maternal Ultrasounds/Referrals: Normal Fetal Ultrasounds or other Referrals:  None Maternal Substance Abuse:  No Significant Maternal Medications:  None Significant Maternal Lab Results: Group B Strep negative  No results found for this or any previous visit (from the past 24 hour(s)).  Patient Active Problem List   Diagnosis Date Noted  . Indication for care in labor and delivery, antepartum 01/05/2021  . URI (upper respiratory infection) 11/26/2020  . Supervision of other normal pregnancy, antepartum 05/23/2020  . HSV-2 infection 09/08/2018    Assessment/Plan:  Natalie Wang is a 30 y.o. G3P1011 at [redacted]w[redacted]d here for SOL/SROM 4/23 @0800 .  #Labor: Continue expectant management. #Pain: Awaiting epidural #FWB: Cat 1 #ID: GBS neg #MOF: breast #MOC: undecided, will discuss when patient more comfortable #Circ: n/a #HSV: no outbreaks this pregnancy, no lesions noted on external exam, taking valtrex.  5/23, MD  01/05/2021, 3:16 AM

## 2021-01-05 NOTE — Procedures (Signed)
  Post-Placental IUD Insertion Procedure Note  Patient identified, informed consent signed prior to delivery, signed copy in chart, time out was performed.    Vaginal, labial and perineal areas thoroughly inspected for lacerations. No laceration identified.  Liletta  - IUD grasped between sterile gloved fingers. Sterile lubrication applied to sterile gloved hand for ease of insertion. Fundus identified through abdominal wall using non-insertion hand. IUD inserted to fundus with bimanual technique. IUD carefully released at the fundus and insertion hand gently removed from vagina.   Strings trimmed to the level of the introitus. Patient tolerated procedure well.   Patient given post procedure instructions and IUD care card with expiration date.  Patient is asked to keep IUD strings tucked in her vagina until her postpartum follow up visit in 4-6 weeks. Patient advised to abstain from sexual intercourse and pulling on strings before her follow-up visit. Patient verbalized an understanding of the plan of care and agrees.   Alric Seton, MD OB Fellow, Faculty [facility], [facility] 01/05/2021 7:23 AM

## 2021-01-05 NOTE — Discharge Instructions (Signed)
Levonorgestrel intrauterine device (IUD) What is this medicine? LEVONORGESTREL IUD (LEE voe nor jes trel) is a contraceptive (birth control) device. The device is placed inside the uterus by a health care provider. It is used to prevent pregnancy. Some devices can also be used to treat heavy bleeding that occurs during your period. This medicine may be used for other purposes; ask your health care provider or pharmacist if you have questions. COMMON BRAND NAME(S): Kyleena, LILETTA, Mirena, Skyla What should I tell my health care provider before I take this medicine? They need to know if you have any of these conditions:  abnormal Pap smear  cancer of the breast, uterus, or cervix  diabetes  endometritis  genital or pelvic infection now or in the past  have more than one sexual partner or your partner has more than one partner  heart disease  history of an ectopic or tubal pregnancy  immune system problems  IUD in place  liver disease or tumor  problems with blood clots or take blood-thinners  seizures  use intravenous drugs  uterus of unusual shape  vaginal bleeding that has not been explained  an unusual or allergic reaction to levonorgestrel, other hormones, silicone, or polyethylene, medicines, foods, dyes, or preservatives  pregnant or trying to get pregnant  breast-feeding How should I use this medicine? This device is placed inside the uterus by a health care professional. Talk to your pediatrician regarding the use of this medicine in children. Special care may be needed. Overdosage: If you think you have taken too much of this medicine contact a poison control center or emergency room at once. NOTE: This medicine is only for you. Do not share this medicine with others. What if I miss a dose? This does not apply. Depending on the brand of device you have inserted, the device will need to be replaced every 3 to 7 years if you wish to continue using this type  of birth control. What may interact with this medicine? Do not take this medicine with any of the following medications:  amprenavir  bosentan  fosamprenavir This medicine may also interact with the following medications:  aprepitant  armodafinil  barbiturate medicines for inducing sleep or treating seizures  bexarotene  boceprevir  griseofulvin  medicines to treat seizures like carbamazepine, ethotoin, felbamate, oxcarbazepine, phenytoin, topiramate  modafinil  pioglitazone  rifabutin  rifampin  rifapentine  some medicines to treat HIV infection like atazanavir, efavirenz, indinavir, lopinavir, nelfinavir, tipranavir, ritonavir  St. John's wort  warfarin This list may not describe all possible interactions. Give your health care provider a list of all the medicines, herbs, non-prescription drugs, or dietary supplements you use. Also tell them if you smoke, drink alcohol, or use illegal drugs. Some items may interact with your medicine. What should I watch for while using this medicine? Visit your doctor or health care professional for regular check ups. See your doctor if you or your partner has sexual contact with others, becomes HIV positive, or gets a sexual transmitted disease. This product does not protect you against HIV infection (AIDS) or other sexually transmitted diseases. You can check the placement of the IUD yourself by reaching up to the top of your vagina with clean fingers to feel the threads. Do not pull on the threads. It is a good habit to check placement after each menstrual period. Call your doctor right away if you feel more of the IUD than just the threads or if you cannot feel the threads   at all. The IUD may come out by itself. You may become pregnant if the device comes out. If you notice that the IUD has come out use a backup birth control method like condoms and call your health care provider. Using tampons will not change the position of the  IUD and are okay to use during your period. This IUD can be safely scanned with magnetic resonance imaging (MRI) only under specific conditions. Before you have an MRI, tell your healthcare provider that you have an IUD in place, and which type of IUD you have in place. What side effects may I notice from receiving this medicine? Side effects that you should report to your doctor or health care professional as soon as possible:  allergic reactions like skin rash, itching or hives, swelling of the face, lips, or tongue  fever, flu-like symptoms  genital sores  high blood pressure  no menstrual period for 6 weeks during use  pain, swelling, warmth in the leg  pelvic pain or tenderness  severe or sudden headache  signs of pregnancy  stomach cramping  sudden shortness of breath  trouble with balance, talking, or walking  unusual vaginal bleeding, discharge  yellowing of the eyes or skin Side effects that usually do not require medical attention (report to your doctor or health care professional if they continue or are bothersome):  acne  breast pain  change in sex drive or performance  changes in weight  cramping, dizziness, or faintness while the device is being inserted  headache  irregular menstrual bleeding within first 3 to 6 months of use  nausea This list may not describe all possible side effects. Call your doctor for medical advice about side effects. You may report side effects to FDA at 1-800-FDA-1088. Where should I keep my medicine? This does not apply. NOTE: This sheet is a summary. It may not cover all possible information. If you have questions about this medicine, talk to your doctor, pharmacist, or health care provider.  2021 Elsevier/Gold Standard (2020-04-30 16:27:45) Postpartum Care After Vaginal Delivery The following information offers guidance about how to care for yourself from the time you deliver your baby to 6-12 weeks after delivery  (postpartum period). If you have problems or questions, contact your health care provider for more specific instructions. Follow these instructions at home: Vaginal bleeding  It is normal to have vaginal bleeding (lochia) after delivery. Wear a sanitary pad for bleeding and discharge. ? During the first week after delivery, the amount and appearance of lochia is often similar to a menstrual period. ? Over the next few weeks, it will gradually decrease to a dry, yellow-brown discharge. ? For most women, lochia stops completely by 4-6 weeks after delivery, but can vary.  Change your sanitary pads frequently. Watch for any changes in your flow, such as: ? A sudden increase in volume. ? A change in color. ? Large blood clots.  If you pass a blood clot from your vagina, save it and call your health care provider. Do not flush blood clots down the toilet before talking with your health care provider.  Do not use tampons or douches until your health care provider approves.  If you are not breastfeeding, your period should return 6-8 weeks after delivery. If you are feeding your baby breast milk only, your period may not return until you stop breastfeeding. Perineal care  Keep the area between the vagina and the anus (perineum) clean and dry. Use medicated pads and pain-relieving  sprays and creams as directed.  If you had a surgical cut in the perineum (episiotomy) or a tear, check the area for signs of infection until you are healed. Check for: ? More redness, swelling, or pain. ? Fluid or blood coming from the cut or tear. ? Warmth. ? Pus or a bad smell.  You may be given a squirt bottle to use instead of wiping to clean the perineum area after you use the bathroom. Pat the area gently to dry it.  To relieve pain caused by an episiotomy, a tear, or swollen veins in the anus (hemorrhoids), take a warm sitz bath 2-3 times a day. In a sitz bath, the warm water should only come up to your hips  and cover your buttocks.   Breast care  In the first few days after delivery, your breasts may feel heavy, full, and uncomfortable (breast engorgement). Milk may also leak from your breasts. Ask your health care provider about ways to help relieve the discomfort.  If you are breastfeeding: ? Wear a bra that supports your breasts and fits well. Use breast pads to absorb milk that leaks. ? Keep your nipples clean and dry. Apply creams and ointments as told. ? You may have uterine contractions every time you breastfeed for up to several weeks after delivery. This helps your uterus return to its normal size. ? If you have any problems with breastfeeding, notify your health care provider or lactation consultant.  If you are not breastfeeding: ? Avoid touching your breasts. Do not squeeze out (express) milk. Doing this can make your breasts produce more milk. ? Wear a good-fitting bra and use cold packs to help with swelling. Intimacy and sexuality  Ask your health care provider when you can engage in sexual activity. This may depend upon: ? Your risk of infection. ? How fast you are healing. ? Your comfort and desire to engage in sexual activity.  You are able to get pregnant after delivery, even if you have not had your period. Talk with your health care provider about methods of birth control (contraception) or family planning if you desire future pregnancies. Medicines  Take over-the-counter and prescription medicines only as told by your health care provider.  Take an over-the-counter stool softener to help ease bowel movements as told by your health care provider.  If you were prescribed an antibiotic medicine, take it as told by your health care provider. Do not stop taking the antibiotic even if you start to feel better.  Review all previous and current prescriptions to check for possible transfer into breast milk. Activity  Gradually return to your normal activities as told by  your health care provider.  Rest as much as possible. Nap while your baby is sleeping. Eating and drinking  Drink enough fluid to keep your urine pale yellow.  To help prevent or relieve constipation, eat high-fiber foods every day.  Choose healthy eating to support breastfeeding or weight loss goals.  Take your prenatal vitamins until your health care provider tells you to stop.   General tips/recommendations  Do not use any products that contain nicotine or tobacco. These products include cigarettes, chewing tobacco, and vaping devices, such as e-cigarettes. If you need help quitting, ask your health care provider.  Do not drink alcohol, especially if you are breastfeeding.  Do not take medications or drugs that are not prescribed to you, especially if you are breastfeeding.  Visit your health care provider for a postpartum checkup within   the first 3-6 weeks after delivery.  Complete a comprehensive postpartum visit no later than 12 weeks after delivery.  Keep all follow-up visits for you and your baby. Contact a health care provider if:  You feel unusually sad or worried.  Your breasts become red, painful, or hard.  You have a fever or other signs of an infection.  You have bleeding that is soaking through one pad an hour or you have blood clots.  You have a severe headache that doesn't go away or you have vision changes.  You have nausea and vomiting and are unable to eat or drink anything for 24 hours. Get help right away if:  You have chest pain or difficulty breathing.  You have sudden, severe leg pain.  You faint or have a seizure.  You have thoughts about hurting yourself or your baby. If you ever feel like you may hurt yourself or others, or have thoughts about taking your own life, get help right away. Go to your nearest emergency department or:  Call your local emergency services (911 in the [place]  The National Suicide Prevention Lifeline at  623-389-8831. This suicide crisis helpline is open 24 hours a day.  Text the Crisis Text Line at 508 350 8403 (in the [place] Summary  The period of time after you deliver your newborn up to 6-12 weeks after delivery is called the postpartum period.  Keep all follow-up visits for you and your baby.  Review all previous and current prescriptions to check for possible transfer into breast milk.  Contact a health care provider if you feel unusually sad or worried during the postpartum period. This information is not intended to replace advice given to you by your health care provider. Make sure you discuss any questions you have with your health care provider. Document Revised: 05/16/2020 Document Reviewed: 05/16/2020 Elsevier Patient Education  2021 [employer].

## 2021-01-05 NOTE — Plan of Care (Signed)

## 2021-01-05 NOTE — Lactation Note (Addendum)
This note was copied from a baby's chart. Lactation Consultation Note  Patient Name: Natalie Wang [id number] Date: 01/05/2021 Reason for consult: Initial assessment;Early term 37-38.6wks Age:32 hours  Initial visit to 10 hours old ET infant of P2 mother with breastfeeding experience. Infant is in basinet. Mother states infant was skin to skin and latched well after the delivery. LC talked about hand expression, mother states she knows how to hand express. Mother explains last feeding infant was at breast for ~72minutes. No pain or discomfort with latch.  Mother reports using her manual breast pump to stimulate breast. Reinforced the importance of skin to skin and its benefits for mother and infant.   Plan: 1-Skin to skin 2-Aim for a deep, comfortable latch 3-Breastfeeding on demand or 8-12 times in 24h period. 4-Keep infant awake during breastfeeding session: massaging breast, infant's hand/shoulder/feet 5-Monitor voids and stools as signs good intake.  6-Encouraged maternal rest, hydration and food intake.  7-Contact LC as needed for feeds/support/concerns/questions  All questions answered. LC brochure provided and [employer] booklet promoted.    Maternal Data Has patient been taught Hand Expression?: Yes Does the patient have breastfeeding experience prior to this delivery?: Yes How long did the patient breastfeed?: 12 months  Feeding Mother's Current Feeding Choice: Breast Milk  Lactation Tools Discussed/Used Tools: Pump Breast pump type: Manual Reason for Pumping: stimulation  Interventions Interventions: Breast feeding basics reviewed;Skin to skin;Hand pump;Expressed milk;Education;Hand express;Breast massage  Discharge Pump: Personal;Manual WIC Program: No  Consult Status Consult Status: Follow-up Date: 01/06/21 Follow-up type: In-patient    Rose-Marie Hickling A Higuera Ancidey 01/05/2021, 5:22 PM

## 2021-01-05 NOTE — Lactation Note (Signed)
Lactation Consultation Note  Patient Name: Natalie Wang Date: 01/05/2021 Reason for consult: L&D Initial assessment;Early term 37-38.6wks;Primapara;1st time breastfeeding Age:30 y.o.  Initial Visit: L & D  Baby was latched and sucking when I arrived; mother denied pain.  Mother was able to latch without any assistance.  Reassured parents that lactation services will be available on the M/B unit.  Allowed time for family bonding.   Maternal Data Does the patient have breastfeeding experience prior to this delivery?: No  Feeding Mother's Current Feeding Choice: Breast Milk  LATCH Score Latch: Grasps breast easily, tongue down, lips flanged, rhythmical sucking.  Audible Swallowing: None  Type of Nipple:  (Did not observe; baby was latched when I arrived)  Comfort (Breast/Nipple): Soft / non-tender  Hold (Positioning): No assistance needed to correctly position infant at breast.      Lactation Tools Discussed/Used    Interventions Interventions: Skin to skin  Discharge    Consult Status Consult Status: Follow-up Date: 01/05/21 Follow-up type: In-patient    Dora Sims 01/05/2021, 7:44 AM

## 2021-01-05 NOTE — Anesthesia Procedure Notes (Signed)

## 2021-01-06 DIAGNOSIS — Z975 Presence of (intrauterine) contraceptive device: Secondary | ICD-10-CM

## 2021-01-06 MED ORDER — GABAPENTIN 600 MG PO TABS
600.0000 mg | ORAL_TABLET | Freq: Two times a day (BID) | ORAL | Status: DC
  Administered 2021-01-06: 600 mg via ORAL
  Filled 2021-01-06 (×2): qty 1

## 2021-01-06 MED ORDER — IBUPROFEN 600 MG PO TABS: 600.0000 mg | ORAL_TABLET | Freq: Four times a day (QID) | ORAL | 0 refills | Status: DC

## 2021-01-06 NOTE — Lactation Note (Signed)
This note was copied from a baby's chart. Lactation Consultation Note  Patient Name: Natalie Wang [id number] Date: 01/06/2021 Reason for consult: Follow-up assessment;Early term 42-38.6wks Age:32 hours   P2 mother whose infant is now 88 hours old.  This is an ETI at 38+4 weeks.  Mother has breast feeding experience.  Family has been discharged and mother is awaiting her paperwork.  She had no questions/concerns related to breast feeding.  Mother denies pain with latching.  Last LATCH score was an 8; baby is voiding/stooling well.  Mother has our OP phone number for questions after discharge.  Engorgement prevention/treatment reviewed.  She has a manual and a DEBP for home use.   Maternal Data    Feeding Mother's Current Feeding Choice: Breast Milk  LATCH Score                    Lactation Tools Discussed/Used    Interventions    Discharge Discharge Education: Engorgement and breast care  Consult Status Consult Status: Complete Date: 01/06/21 Follow-up type: Call as needed    [name] Natalie Wang 01/06/2021, 1:59 PM

## 2021-01-06 NOTE — Progress Notes (Signed)
POSTPARTUM PROGRESS NOTE  Post Partum Day 1  Subjective:  Natalie Wang is a 30 y.o. [id number] s/p SVD at [redacted]w[redacted]d.  No acute events overnight.  Pt denies problems with ambulating, voiding or po intake.  She denies nausea or vomiting.  Pain is well controlled.  She has had flatus. She has had bowel movement.  Lochia Minimal.   Objective: Blood pressure 126/76, pulse 79, temperature 97.7 F (36.5 C), temperature source Oral, resp. rate 16, height 5\' 6"  (1.676 m), weight 90 kg, last menstrual period 03/28/2020, SpO2 99 %, unknown if currently breastfeeding.  Physical Exam:  General: alert, cooperative and no distress Chest: no respiratory distress Heart:regular rate, distal pulses intact Abdomen: soft, nontender,  Uterine Fundus: firm, appropriately tender DVT Evaluation: No calf swelling or tenderness Extremities: No LE edema Skin: warm, dry  Recent Labs    01/05/21 0248  HGB 12.5  HCT 36.0    Assessment/Plan: Natalie Wang is a 30 y.o. 38 s/p SVD at [redacted]w[redacted]d   PPD#1 - Doing well, meeting all PP milestones Contraception: IP Liletta Feeding: breast  Dispo: Plan for discharge today or tomorrow.   LOS: 1 day   [redacted]w[redacted]d, Medical Student 01/06/2021, 8:05 AM

## 2021-01-06 NOTE — Anesthesia Postprocedure Evaluation (Signed)
Anesthesia Post Note  Patient: Natalie Wang  Procedure(s) Performed: AN AD HOC LABOR EPIDURAL     Patient location during evaluation: Mother Baby Anesthesia Type: Epidural Level of consciousness: awake, awake and alert and oriented Pain management: pain level controlled Vital Signs Assessment: post-procedure vital signs reviewed and stable Respiratory status: spontaneous breathing and respiratory function stable Cardiovascular status: blood pressure returned to baseline Postop Assessment: no headache, epidural receding, patient able to bend at knees, adequate PO intake, no backache, no apparent nausea or vomiting and able to ambulate Anesthetic complications: no   No complications documented.  Last Vitals:  Vitals:   01/06/21 0030 01/06/21 0549  BP: 124/67 126/76  Pulse: 71 79  Resp: 18 16  Temp: 36.9 C 36.5 C  SpO2: 98% 99%    Last Pain:  Vitals:   01/06/21 0549  TempSrc: Oral  PainSc:    Pain Goal:                   Cleda Clarks

## 2021-01-06 NOTE — Social Work (Signed)
CSW acknowledges consult. CSW observed MOB resting upon arrival. CSW will see MOB later today.   Kamal Jurgens, MSW, LCSW [facility]  Clinical Social Worker  [phone] 01/06/2021  9:52 AM 

## 2021-01-06 NOTE — Social Work (Signed)
CSW received consult for hx of Anxiety and Depression.  CSW met with MOB to offer support and complete assessment.   CSW met with MOB at bedside. CSW introduced role and congratulated MOB. CSW observed MOB sitting on the bed and FOB holding and bonding with the infant. CSW inquired how MOB has feels since giving birth. MOB reports, "I feel great."   CSW asked MOB about her history of anxiety and depression. MOB acknowledges she has a history of depression and anxiety but more so depression.  MOB reports she was diagnosed at the age 30.MOB reports as an adult she has not experienced depression as much, nothing out of the normal feelings of sadness or anxiousness. MOB denies experiencing any symptoms during her pregnancy.  MOB denies current history of therapy or medication treatment. CSW  assessed  MOB for safety. MOB does thoughts of harm to self and others. CSW inquired about MOB supports. MOB reports her spouse/FOB, mother and mother in law have been very supportive .   CSW provided education regarding the baby blues period vs. perinatal mood disorders, discussed treatment and gave resources for mental health follow up. MOB reports she did not experience postpartum after her last birth. MOB was appreciative of the resources. CSW recommended MOB complete a self-evaluation during the postpartum time period using the New Mom Checklist from Postpartum Progress and encouraged MOB to contact a medical professional if symptoms are noted. MOB receptive to the check list.    CSW provided review of Sudden Infant Death Syndrome (SIDS) precautions and informed MOB no co-sleeping with the infant. MOB reports the infant will sleep in the bassinet and a pack n' play. CSW asked MOB if she has essential items for the infant. MOB reports she has essential items for the infant. CSW assessed  MOB for further needs. MOB reports no further need.   CSW identifies no further need for intervention and no barriers to discharge  at this time.   Natalie Wang, MSW, LCSW [facility]  Clinical Social Worker  [phone] 01/06/2021  12:55 PM 

## 2021-01-09 ENCOUNTER — Encounter: Payer: Medicaid Other | Admitting: Family Medicine

## 2021-02-19 ENCOUNTER — Ambulatory Visit (INDEPENDENT_AMBULATORY_CARE_PROVIDER_SITE_OTHER): Payer: Medicaid Other | Admitting: Family Medicine

## 2021-02-19 ENCOUNTER — Other Ambulatory Visit: Payer: Self-pay

## 2021-02-19 ENCOUNTER — Encounter: Payer: Self-pay | Admitting: Family Medicine

## 2021-02-19 DIAGNOSIS — Z30430 Encounter for insertion of intrauterine contraceptive device: Secondary | ICD-10-CM

## 2021-02-19 LAB — POCT URINE PREGNANCY: Preg Test, Ur: NEGATIVE

## 2021-02-19 MED ORDER — LEVONORGESTREL 20.1 MCG/DAY IU IUD
1.0000 | INTRAUTERINE_SYSTEM | Freq: Once | INTRAUTERINE | Status: AC
  Administered 2021-02-19: 1 via INTRAUTERINE

## 2021-02-19 NOTE — Progress Notes (Signed)
Post Partum Visit Note  Natalie Wang is a 30 y.o. G73P2012 female who presents for a postpartum visit. She is 6 weeks postpartum following a normal spontaneous vaginal delivery.  I have fully reviewed the prenatal and intrapartum course. The delivery was at 38.4 gestational weeks.  Anesthesia: epidural. Postpartum course has been unremarkable. Baby is doing well. Baby is feeding by breast. Bleeding no bleeding. Bowel function is normal. Bladder function is normal. Patient is sexually active. Contraception method is IUD-one was placed at the hospital but, it fell out within the first week. Postpartum depression screening: negative.   The pregnancy intention screening data noted above was reviewed. Potential methods of contraception were discussed. The patient elected to proceed with IUD or IUS.    Edinburgh Postnatal Depression Scale - 02/19/21 1055      Edinburgh Postnatal Depression Scale:  In the Past 7 Days   I have been able to laugh and see the funny side of things. 0    I have looked forward with enjoyment to things. 0    I have blamed myself unnecessarily when things went wrong. 0    I have been anxious or worried for no good reason. 0    I have felt scared or panicky for no good reason. 0    Things have been getting on top of me. 0    I have been so unhappy that I have had difficulty sleeping. 0    I have felt sad or miserable. 0    I have been so unhappy that I have been crying. 0    The thought of harming myself has occurred to me. 0    Edinburgh Postnatal Depression Scale Total 0           Health Maintenance Due  Topic Date Due  . COVID-19 Vaccine (1) Never done  . Pneumococcal Vaccine 46-41 Years old (1 of 2 - PPSV23) Never done    The following portions of the patient's history were reviewed and updated as appropriate: allergies, current medications, past family history, past medical history, past social history, past surgical history and problem  list.  Review of Systems Pertinent items are noted in HPI.  Objective:  BP 114/77   Pulse 92   Wt 171 lb (77.6 kg)   LMP 03/28/2020   Breastfeeding Yes   BMI 27.60 kg/m    General:  alert, cooperative and no distress  Lungs: clear to auscultation bilaterally  Heart:  regular rate and rhythm, S1, S2 normal, no murmur, click, rub or gallop  Abdomen: soft, non-tender; bowel sounds normal; no masses,  no organomegaly   GU exam:  normal       IUD Procedure Note Patient identified, informed consent performed, signed copy in chart, time out was performed.  Urine pregnancy test negative.  Speculum placed in the vagina.  Cervix visualized.  Cleaned with Betadine x 2. Cervix grasped anteriorly with a single tooth tenaculum.  Uterus sounded to 9 cm.  Liletta  IUD placed per manufacturer's recommendations.  Strings trimmed to 3 cm. Tenaculum was removed, good hemostasis noted.  Patient tolerated procedure well.   Patient given post procedure instructions and Liletta care card with expiration date.  Patient is asked to check IUD strings periodically and follow up in 4-6 weeks for IUD check.   Assessment:    There are no diagnoses linked to this encounter.  Normal postpartum exam.   Plan:   Essential components of care per ACOG  recommendations:  1.  Mood and well being: Patient with negative depression screening today. Reviewed local resources for support.   2. Infant care and feeding:  -Patient currently breastmilk feeding? Yes. Reviewed importance of draining breast regularly to support lactation.  -Social determinants of health (SDOH) reviewed in EPIC. No concerns  3. Sexuality, contraception and birth spacing - Patient does not want a pregnancy in the next year. - Reviewed forms of contraception in tiered fashion. Patient desired IUD today.   - Discussed birth spacing of 18 months  4. Sleep and fatigue -Encouraged family/partner/community support of 4 hrs of uninterrupted  sleep to help with mood and fatigue  5. Physical Recovery  - Discussed patients delivery and complications. She describes her labor as good. - Patient had a Vaginal, no problems at delivery. Perineal healing reviewed. Patient expressed understanding - Patient has urinary incontinence? No. - Patient is safe to resume physical and sexual activity  6.  Health Maintenance - Last pap smear  Diagnosis  Date Value Ref Range Status  05/23/2020   Final   - Negative for intraepithelial lesion or malignancy (NILM)   Pap smear not done at today's visit.  -Breast Cancer screening indicated? No.   7. Chronic Disease/Pregnancy Condition follow up: None  - PCP follow up  Levie Heritage, DO [facility]

## 2021-02-21 ENCOUNTER — Encounter: Payer: Self-pay | Admitting: General Practice

## 2021-03-19 ENCOUNTER — Ambulatory Visit: Payer: Medicaid Other | Admitting: Family Medicine

## 2022-12-28 ENCOUNTER — Encounter: Payer: Self-pay | Admitting: *Deleted

## 2023-03-05 ENCOUNTER — Institutional Professional Consult (permissible substitution): Payer: Medicaid Other | Admitting: Internal Medicine

## 2024-05-17 ENCOUNTER — Ambulatory Visit (INDEPENDENT_AMBULATORY_CARE_PROVIDER_SITE_OTHER): Payer: Self-pay | Admitting: Internal Medicine

## 2024-05-17 ENCOUNTER — Encounter: Payer: Self-pay | Admitting: Internal Medicine

## 2024-05-17 VITALS — BP 104/68 | HR 66 | Temp 97.8°F | Ht 66.0 in | Wt 168.6 lb

## 2024-05-17 DIAGNOSIS — B009 Herpesviral infection, unspecified: Secondary | ICD-10-CM

## 2024-05-17 DIAGNOSIS — R632 Polyphagia: Secondary | ICD-10-CM

## 2024-05-17 DIAGNOSIS — R1032 Left lower quadrant pain: Secondary | ICD-10-CM

## 2024-05-17 DIAGNOSIS — F411 Generalized anxiety disorder: Secondary | ICD-10-CM

## 2024-05-17 DIAGNOSIS — J4520 Mild intermittent asthma, uncomplicated: Secondary | ICD-10-CM

## 2024-05-17 LAB — BASIC METABOLIC PANEL WITH GFR
BUN: 14 mg/dL (ref 6–23)
CO2: 26 meq/L (ref 19–32)
Calcium: 9.3 mg/dL (ref 8.4–10.5)
Chloride: 103 meq/L (ref 96–112)
Creatinine, Ser: 0.65 mg/dL (ref 0.40–1.20)
GFR: 114.41 mL/min (ref >60.00)
Glucose, Bld: 79 mg/dL (ref 70–99)
Potassium: 3.6 meq/L (ref 3.5–5.1)
Sodium: 141 meq/L (ref 135–145)

## 2024-05-17 LAB — HEMOGLOBIN A1C: Hgb A1c MFr Bld: 5.3 % (ref 4.6–6.5)

## 2024-05-17 MED ORDER — ALBUTEROL SULFATE HFA 108 (90 BASE) MCG/ACT IN AERS: 2.0000 | INHALATION_SPRAY | RESPIRATORY_TRACT | 2 refills | Status: AC | PRN

## 2024-05-17 NOTE — Patient Instructions (Addendum)
  VISIT SUMMARY: Today, you came in for your first visit to establish care and discuss your abdominal pain , anxiety, and hunger. We reviewed your medical history, including your asthma and history of genital herpes, and discussed your current symptoms and stressors.  YOUR PLAN: -ADULT WELLNESS: You are up to date on your tetanus vaccine and Pap smear. We discussed the importance of routine health maintenance, including annual exams and screenings. We will schedule your annual physical exam in 3 months and have ordered blood work to check your hemoglobin, platelets, kidney and liver function, electrolytes, thyroid, and cholesterol. We will also screen for diabetes.  -LEFT LOWER QUADRANT ABDOMINAL PAIN: You have been experiencing intermittent lower left abdominal pain for the past year. This could be due to constipation or diverticulosis. We recommend keeping a diary of your pain episodes, noting the timing, duration, and any potential dietary triggers. If the pain persists or worsens, we may need to do further diagnostic testing such as an ultrasound or CT scan.  -POLYPHAGIA: You have reported constant hunger and thirst, which could be due to a high metabolism or potential diabetes. We will screen for diabetes, check your thyroid levels, and evaluate your kidney function and electrolytes to determine the cause.  -MILD INTERMITTENT ASTHMA: Your asthma is well-controlled with rare use of an inhaler, but your current inhaler is expired. We have prescribed a new albuterol  inhaler with refills and sent the prescription to [facility] pharmacy.  -ANXIETY RELATED TO LIFE STRESSORS: You are experiencing significant stress due to managing multiple responsibilities. We discussed non-pharmacological management options, including relaxation techniques such as yoga, deep breathing, and walking. We also suggested over-the-counter supplements like Ashwagandha and magnesium glycinate to help with relaxation and  sleep.  INSTRUCTIONS: Please schedule your annual physical exam in 3 months. Follow up with the blood work as ordered, and keep a diary of your abdominal pain episodes. Pick up your new albuterol  inhaler from [facility] pharmacy. Consider trying the recommended relaxation techniques and supplements to manage your stress.                      Contains text generated by [employer].                                 Contains text generated by [employer]bridge.

## 2024-05-17 NOTE — Progress Notes (Signed)
 [facility] [address] [place] [address] Dept: [phone] Dept Fax: [phone]  New Patient Office Visit  Subjective:   Natalie Wang Nov 07, 1990 05/17/2024  Chief Complaint  Patient presents with   Establish Care    HPI: Natalie Wang presents today to establish care at [facility]. Introduced to Publishing rights manager role and practice setting.  All questions answered.  Concerns: See below   Discussed the use of AI scribe software for clinical note transcription with the patient, who gave verbal consent to proceed.  History of Present Illness   [name] is a 33 year old female who presents for establishment of care and evaluation of abdominal pain and weight management concerns.  She has been experiencing lower left abdominal pain for the past year. The pain is described as achy and occurs intermittently, often waking her at night. No specific triggers or alleviating factors have been identified. No associated symptoms such as fever, nausea, vomiting, diarrhea, constipation, or blood in stool. She does not track her bowel movements closely.  She is concerned about her inability to lose weight despite being physically active, including running and weightlifting. She experiences constant hunger and thirst, carrying water bottles everywhere. She consumes a lot of coffee and tries to maintain her electrolyte balance with tablets. She has not been screened for diabetes but notes her mother has prediabetes.  She reports significant stress due to managing multiple responsibilities, including running businesses, volunteering, and caring for her children, including a recently added stepchild. She experiences frequent stress-related symptoms, describing it as feeling like an 'electrical pulse' through her body.  Her medical history includes asthma, managed with an  albuterol  inhaler used as needed, primarily during allergy seasons. Her last inhaler was obtained three years ago, and she reports it is likely expired. She experiences wheezing during spring and fall but notes that her asthma was more pronounced during pregnancy. She also has a history of genital herpes with outbreaks occurring approximately once every 12 to 18 months. During outbreaks, she was advised to take Valtrex  twice daily, but she is not currently on any antiviral medication as she has not had recent outbreaks. She has an IUD in place and has undergone an appendectomy in the past. She has no known drug allergies and is not currently taking any medications regularly.       The following portions of the patient's history were reviewed and updated as appropriate: past medical history, past surgical history, family history, social history, allergies, medications, and problem list.   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  The remainder of the ROS are negative.   Objective:   Today's Vitals   05/17/24 1401  BP: 104/68  Pulse: 66  Temp: 97.8 F (36.6 C)  TempSrc: Temporal  SpO2: 98%  Weight: 168 lb 9.6 oz (76.5 kg)   Height: 5' 6 (1.676 m)    GENERAL: Well-appearing, in NAD. Well nourished.  SKIN: Pink, warm and dry. No rash, lesion, ulceration, or ecchymoses.  NECK: Trachea midline. Full ROM w/o pain or tenderness. No lymphadenopathy. No thyromegaly or palpable masses.  RESPIRATORY: Chest wall symmetrical. Respirations even and non-labored. Breath sounds clear to auscultation bilaterally.  CARDIAC: S1, S2 present, regular rate and rhythm. Peripheral pulses 2+ bilaterally.  GI: Abdomen soft, non-tender. Normoactive bowel sounds. No rebound tenderness. No hepatomegaly or splenomegaly. No CVA tenderness.  EXTREMITIES: Without clubbing, cyanosis, or edema.  NEUROLOGIC: No motor or sensory deficits. Steady, even gait.  PSYCH/MENTAL STATUS: Alert, oriented x 3. Cooperative, appropriate mood and affect.   Health Maintenance Due  Topic Date Due   Pneumococcal Vaccine (1 of 2 - PCV) Never done     No results found for any visits on 05/17/24.  Assessment & Plan:   Assessment and Plan    Left lower quadrant abdominal pain Intermittent pain for a year, differential includes constipation, dietary triggers, gas, diverticulosis. No current pain during examination. - Instruct to keep a diary of pain episodes, including timing, duration, and potential dietary triggers. - Consider further diagnostic testing such as ultrasound or CT scan if pain persists or worsens.  Polyphagia Reports insatiable hunger and thirst.  - Screen for diabetes. - Check thyroid levels. - Evaluate kidney function and electrolytes.  Mild intermittent asthma Well-controlled asthma with rare inhaler use. Current inhaler expired. - Prescribe new albuterol  inhaler with refills.  Anxiety related to life stressors Experiencing significant stressors, prefers non-pharmacological management. Discussed lifestyle modifications. - Recommend relaxation techniques such as yoga, deep breathing, and walking. - Suggest over-the-counter  supplements like Ashwagandha and magnesium glycinate for relaxation and sleep.   HSV-2 Infection  - No current outbreaks - Valtrex  PRN     Up to date on tetanus vaccine and Pap smear. Discussed routine health maintenance including annual exams and screenings.  Orders Placed This Encounter  Procedures   Hemoglobin A1C   TSH Rfx on Abnormal to Free T4   Basic Metabolic Panel (BMET)   Meds ordered this encounter  Medications   albuterol  (VENTOLIN  HFA) 108 (90 Base) MCG/ACT inhaler    Sig: Inhale 2 puffs into the lungs every 4 (four) hours as needed for wheezing or shortness of breath.    Dispense:  18 g    Refill:  2    Supervising Provider:   SEBASTIAN BEVERLEY NOVAK [[id number]]    Return in about 3 months (around 08/16/2024) for Annual Physical Exam with fasting lab work.   Rosina Senters, FNP

## 2024-05-19 LAB — TSH RFX ON ABNORMAL TO FREE T4: TSH: 2.02 u[IU]/mL (ref 0.450–4.500)

## 2024-05-24 ENCOUNTER — Ambulatory Visit: Payer: Self-pay | Admitting: Internal Medicine

## 2024-08-17 ENCOUNTER — Encounter: Admitting: Internal Medicine

## 2024-09-19 ENCOUNTER — Encounter: Admitting: Internal Medicine
